# Patient Record
Sex: Female | Born: 1985 | Hispanic: Yes | Marital: Single | Smoking: Never smoker
Health system: Southern US, Community
[De-identification: ages and names within clinical notes are randomized; demographics above are authoritative.]

## PROBLEM LIST (undated history)

## (undated) DIAGNOSIS — D649 Anemia, unspecified: Secondary | ICD-10-CM

## (undated) DIAGNOSIS — I10 Essential (primary) hypertension: Secondary | ICD-10-CM

## (undated) DIAGNOSIS — R569 Unspecified convulsions: Secondary | ICD-10-CM

## (undated) HISTORY — PX: TUBAL LIGATION: SHX77

---

## 2013-12-06 ENCOUNTER — Emergency Department (HOSPITAL_COMMUNITY)
Admission: EM | Admit: 2013-12-06 | Discharge: 2013-12-07 | Disposition: A | Discharge status: Home / Self Care | Payer: Medicaid Other | Attending: Emergency Medicine | Admitting: Emergency Medicine

## 2013-12-06 ENCOUNTER — Encounter (HOSPITAL_COMMUNITY): Payer: Self-pay | Admitting: Emergency Medicine

## 2013-12-06 DIAGNOSIS — R102 Pelvic and perineal pain unspecified side: Secondary | ICD-10-CM

## 2013-12-06 DIAGNOSIS — N949 Unspecified condition associated with female genital organs and menstrual cycle: Secondary | ICD-10-CM | POA: Insufficient documentation

## 2013-12-06 DIAGNOSIS — N938 Other specified abnormal uterine and vaginal bleeding: Secondary | ICD-10-CM

## 2013-12-06 DIAGNOSIS — Z3202 Encounter for pregnancy test, result negative: Secondary | ICD-10-CM | POA: Insufficient documentation

## 2013-12-06 LAB — URINALYSIS, ROUTINE W REFLEX MICROSCOPIC
Bilirubin Urine: NEGATIVE
Glucose, UA: NEGATIVE mg/dL
Ketones, ur: NEGATIVE mg/dL
Nitrite: NEGATIVE
PROTEIN: NEGATIVE mg/dL
Specific Gravity, Urine: 1.002 — ABNORMAL LOW (ref 1.005–1.030)
UROBILINOGEN UA: 0.2 mg/dL (ref 0.0–1.0)
pH: 7 (ref 5.0–8.0)

## 2013-12-06 LAB — CBC WITH DIFFERENTIAL/PLATELET
BASOS ABS: 0 10*3/uL (ref 0.0–0.1)
Basophils Relative: 0 % (ref 0–1)
EOS PCT: 1 % (ref 0–5)
Eosinophils Absolute: 0.1 10*3/uL (ref 0.0–0.7)
HEMATOCRIT: 37.2 % (ref 36.0–46.0)
Hemoglobin: 13.1 g/dL (ref 12.0–15.0)
LYMPHS ABS: 3.4 10*3/uL (ref 0.7–4.0)
LYMPHS PCT: 36 % (ref 12–46)
MCH: 31.8 pg (ref 26.0–34.0)
MCHC: 35.2 g/dL (ref 30.0–36.0)
MCV: 90.3 fL (ref 78.0–100.0)
MONO ABS: 0.6 10*3/uL (ref 0.1–1.0)
MONOS PCT: 6 % (ref 3–12)
NEUTROS ABS: 5.5 10*3/uL (ref 1.7–7.7)
Neutrophils Relative %: 58 % (ref 43–77)
Platelets: 193 10*3/uL (ref 150–400)
RBC: 4.12 MIL/uL (ref 3.87–5.11)
RDW: 12.3 % (ref 11.5–15.5)
WBC: 9.6 10*3/uL (ref 4.0–10.5)

## 2013-12-06 LAB — COMPREHENSIVE METABOLIC PANEL
ALT: 12 U/L (ref 0–35)
AST: 15 U/L (ref 0–37)
Albumin: 4 g/dL (ref 3.5–5.2)
Alkaline Phosphatase: 52 U/L (ref 39–117)
BILIRUBIN TOTAL: 0.4 mg/dL (ref 0.3–1.2)
BUN: 8 mg/dL (ref 6–23)
CHLORIDE: 102 meq/L (ref 96–112)
CO2: 24 meq/L (ref 19–32)
Calcium: 9.1 mg/dL (ref 8.4–10.5)
Creatinine, Ser: 0.79 mg/dL (ref 0.50–1.10)
GLUCOSE: 90 mg/dL (ref 70–99)
Potassium: 4.1 mEq/L (ref 3.7–5.3)
Sodium: 138 mEq/L (ref 137–147)
Total Protein: 7.5 g/dL (ref 6.0–8.3)

## 2013-12-06 LAB — URINE MICROSCOPIC-ADD ON

## 2013-12-06 LAB — POC URINE PREG, ED: Preg Test, Ur: NEGATIVE

## 2013-12-06 MED ORDER — OXYCODONE-ACETAMINOPHEN 5-325 MG PO TABS
1.0000 | ORAL_TABLET | Freq: Once | ORAL | Status: AC
  Administered 2013-12-06: 1 via ORAL
  Filled 2013-12-06: qty 1

## 2013-12-06 NOTE — ED Notes (Signed)
Pt looking very uncomfortable; moaning in pain; asked if anything RN could give her.

## 2013-12-06 NOTE — ED Notes (Addendum)
C/o L sided pelvic pain x 3 weeks.  Reports regular BM daily.  Denies urinary complaints. LMP in January.  History of tubal ligation.

## 2013-12-07 ENCOUNTER — Emergency Department (HOSPITAL_COMMUNITY): Payer: Medicaid Other

## 2013-12-07 MED ORDER — HYDROCODONE-ACETAMINOPHEN 5-325 MG PO TABS: 1.0000 | ORAL_TABLET | Freq: Four times a day (QID) | ORAL | Status: DC | PRN

## 2013-12-07 MED ORDER — IBUPROFEN 200 MG PO TABS
600.0000 mg | ORAL_TABLET | Freq: Once | ORAL | Status: AC
  Administered 2013-12-07: 600 mg via ORAL
  Filled 2013-12-07 (×2): qty 1

## 2013-12-07 MED ORDER — HYDROCODONE-ACETAMINOPHEN 5-325 MG PO TABS
1.0000 | ORAL_TABLET | Freq: Once | ORAL | Status: AC
Start: 2013-12-07 — End: 2013-12-07
  Administered 2013-12-07: 1 via ORAL
  Filled 2013-12-07: qty 1

## 2013-12-07 MED ORDER — IBUPROFEN 600 MG PO TABS: 600.0000 mg | ORAL_TABLET | Freq: Four times a day (QID) | ORAL | Status: DC | PRN

## 2013-12-07 NOTE — ED Notes (Signed)
Back from US.

## 2013-12-07 NOTE — ED Notes (Signed)
[facility] pt alert, NAD, calm, interactive, "feels better", [facility]

## 2013-12-07 NOTE — ED Notes (Signed)
Pt lying in dark, watching movie on phone, [facility] rates pain 5/10, NAD, calm, interactive, resps e/u, speaking in clear complete sentences, VSS. US results reviewed, pending EDP re-eval.

## 2013-12-07 NOTE — ED Notes (Signed)
Pt to US.

## 2013-12-07 NOTE — ED Provider Notes (Signed)
CSN: [id number]     Arrival date & time 12/06/13  2010 History   First MD Initiated Contact with Patient 12/06/13 2351     Chief Complaint  Patient presents with  . Pelvic Pain     (Consider location/radiation/quality/duration/timing/severity/associated sxs/prior Treatment) HPI Comments: Along with the pelvic pain, pt denies any vaginal bleeding or discharge. Pt seen at [facility] ED, had a pelvic exam and discharged to f/u with Gyne - but she has been awaiting follow up for  The past 2+ weeks, and her pain is getting unbearable. Upreg is neg.  Patient is a 28 y.o. female presenting with pelvic pain. The history is provided by the patient.  Pelvic Pain This is a new problem. Episode onset: 3 weeks ago. The problem occurs constantly. The problem has not changed since onset.Pertinent negatives include no chest pain, no abdominal pain, no headaches and no shortness of breath. Nothing aggravates the symptoms. Nothing relieves the symptoms. She has tried acetaminophen and rest for the symptoms. The treatment provided mild relief.    History reviewed. No pertinent past medical history. Past Surgical History  Procedure Laterality Date  . Cesarean section    . Tubal ligation     No family history on file. History  Substance Use Topics  . Smoking status: Never Smoker   . Smokeless tobacco: Not on file  . Alcohol Use: No   OB History   Grav Para Term Preterm Abortions TAB SAB Ect Mult Living                 Review of Systems  Constitutional: Negative for activity change.  Respiratory: Negative for shortness of breath.   Cardiovascular: Negative for chest pain.  Gastrointestinal: Negative for nausea, vomiting and abdominal pain.  Genitourinary: Positive for pelvic pain. Negative for dysuria, vaginal bleeding and vaginal discharge.  Musculoskeletal: Negative for neck pain.  Neurological: Negative for headaches.      Allergies  Review of patient's allergies indicates no known  allergies.  Home Medications   Current Outpatient Rx  Name  Route  Sig  Dispense  Refill  . acetaminophen (TYLENOL) 500 MG tablet   Oral   Take 1,000 mg by mouth every 6 (six) hours as needed for moderate pain.          BP 114/63  Pulse 59  Temp(Src) 97.8 F (36.6 C) (Oral)  Resp 14  Ht 5\' 4"  (1.626 m)  Wt 140 lb (63.504 kg)  BMI 24.02 kg/m2  SpO2 100%  LMP 09/27/2013 Physical Exam  Nursing note and vitals reviewed. Constitutional: She is oriented to person, place, and time. She appears well-developed and well-nourished.  HENT:  Head: Normocephalic and atraumatic.  Eyes: Conjunctivae and EOM are normal. Pupils are equal, round, and reactive to light.  Neck: Normal range of motion. Neck supple.  Cardiovascular: Normal rate, regular rhythm, normal heart sounds and intact distal pulses.   No murmur heard. Pulmonary/Chest: Effort normal. No respiratory distress. She has no wheezes.  Abdominal: Soft. Bowel sounds are normal. She exhibits no distension. There is tenderness. There is no rebound and no guarding.  LLQ tenderness.  Genitourinary: Vagina normal and uterus normal.  External exam - normal, no lesions Speculum exam: Pt has some dark blood, no discharge. Bimanual exam: Patient has no CMT, no adnexal tenderness or fullness and cervical os is closed  Neurological: She is alert and oriented to person, place, and time.  Skin: Skin is warm and dry.    ED Course  Procedures (including critical care time) Labs Review Labs Reviewed  URINALYSIS, ROUTINE W REFLEX MICROSCOPIC - Abnormal; Notable for the following:    APPearance CLOUDY (*)    Specific Gravity, Urine 1.002 (*)    Hgb urine dipstick LARGE (*)    Leukocytes, UA LARGE (*)    All other components within normal limits  URINE MICROSCOPIC-ADD ON - Abnormal; Notable for the following:    Squamous Epithelial / LPF MANY (*)    Bacteria, UA FEW (*)    All other components within normal limits  CBC WITH DIFFERENTIAL   COMPREHENSIVE METABOLIC PANEL  POC URINE PREG, ED   Imaging Review No results found.   EKG Interpretation None      MDM   Final diagnoses:  None    DDx includes: Colitis Abscess Diverticulitis Peritonitis Hernia Nephrolithiasis Pyelonephritis UTI/Cystitis Ovarian cyst TOA Ectopic pregnancy PID STD  PT comes in with cc of abd pain. Lower quadrant abd pain. Pelvic exam benign, but we did appreciate blood, and there is LLQ pain. US ordered - likely is ovarian cyst - however, it showed no pathology at all. D/C with Gyne f/u.        Derwood KaplanAnkit Prayan Ulin, MD 12/07/13 [phone]

## 2013-12-07 NOTE — Discharge Instructions (Signed)
Please see the gynecologist for the pain. Results of the ultrasound are normal as below:  EXAM:  TRANSABDOMINAL AND TRANSVAGINAL ULTRASOUND OF PELVIS  TECHNIQUE:  Both transabdominal and transvaginal ultrasound examinations of the  pelvis were performed. Transabdominal technique was performed for  global imaging of the pelvis including uterus, ovaries, adnexal  regions, and pelvic cul-de-sac. It was necessary to proceed with  endovaginal exam following the transabdominal exam to visualize the  ovaries.  COMPARISON: No priors.  FINDINGS:  Uterus  Measurements: 9.6 x 4.2 x 5.2 cm. No fibroids or other mass  visualized.  Endometrium  Thickness: 2.5 mm. No focal abnormality visualized.  Right ovary  Measurements: 2.9 x 1.7 x 1.7 cm. Normal appearance/no adnexal mass.  Left ovary  Measurements: 2.8 x 1.9 x 2.1 cm. Normal appearance/no adnexal mass.  Other findings  No free fluid.  IMPRESSION:  1. No acute findings to account for the patient's symptoms.  2. No definite fibroids or endometrial abnormality.  Electronically Signed  By: Trudie Reed M.D.  On: 12/07/2013 04:24    Abdominal Pain, Women Abdominal (stomach, pelvic, or belly) pain can be caused by many things. It is important to tell your doctor:  The location of the pain.  Does it come and go or is it present all the time?  Are there things that start the pain (eating certain foods, exercise)?  Are there other symptoms associated with the pain (fever, nausea, vomiting, diarrhea)? All of this is helpful to know when trying to find the cause of the pain. CAUSES   Stomach: virus or bacteria infection, or ulcer.  Intestine: appendicitis (inflamed appendix), regional ileitis (Crohn's disease), ulcerative colitis (inflamed colon), irritable bowel syndrome, diverticulitis (inflamed diverticulum of the colon), or cancer of the stomach or intestine.  Gallbladder disease or stones in the gallbladder.  Kidney disease,  kidney stones, or infection.  Pancreas infection or cancer.  Fibromyalgia (pain disorder).  Diseases of the female organs:  Uterus: fibroid (non-cancerous) tumors or infection.  Fallopian tubes: infection or tubal pregnancy.  Ovary: cysts or tumors.  Pelvic adhesions (scar tissue).  Endometriosis (uterus lining tissue growing in the pelvis and on the pelvic organs).  Pelvic congestion syndrome (female organs filling up with blood just before the menstrual period).  Pain with the menstrual period.  Pain with ovulation (producing an egg).  Pain with an IUD (intrauterine device, birth control) in the uterus.  Cancer of the female organs.  Functional pain (pain not caused by a disease, may improve without treatment).  Psychological pain.  Depression. DIAGNOSIS  Your doctor will decide the seriousness of your pain by doing an examination.  Blood tests.  X-rays.  Ultrasound.  CT scan (computed tomography, special type of X-ray).  MRI (magnetic resonance imaging).  Cultures, for infection.  Barium enema (dye inserted in the large intestine, to better view it with X-rays).  Colonoscopy (looking in intestine with a lighted tube).  Laparoscopy (minor surgery, looking in abdomen with a lighted tube).  Major abdominal exploratory surgery (looking in abdomen with a large incision). TREATMENT  The treatment will depend on the cause of the pain.   Many cases can be observed and treated at home.  Over-the-counter medicines recommended by your caregiver.  Prescription medicine.  Antibiotics, for infection.  Birth control pills, for painful periods or for ovulation pain.  Hormone treatment, for endometriosis.  Nerve blocking injections.  Physical therapy.  Antidepressants.  Counseling with a psychologist or psychiatrist.  Minor or major surgery. HOME CARE  INSTRUCTIONS   Do not take laxatives, unless directed by your caregiver.  Take over-the-counter  pain medicine only if ordered by your caregiver. Do not take aspirin because it can cause an upset stomach or bleeding.  Try a clear liquid diet (broth or water) as ordered by your caregiver. Slowly move to a bland diet, as tolerated, if the pain is related to the stomach or intestine.  Have a thermometer and take your temperature several times a day, and record it.  Bed rest and sleep, if it helps the pain.  Avoid sexual intercourse, if it causes pain.  Avoid stressful situations.  Keep your follow-up appointments and tests, as your caregiver orders.  If the pain does not go away with medicine or surgery, you may try:  Acupuncture.  Relaxation exercises (yoga, meditation).  Group therapy.  Counseling. SEEK MEDICAL CARE IF:   You notice certain foods cause stomach pain.  Your home care treatment is not helping your pain.  You need stronger pain medicine.  You want your IUD removed.  You feel faint or lightheaded.  You develop nausea and vomiting.  You develop a rash.  You are having side effects or an allergy to your medicine. SEEK IMMEDIATE MEDICAL CARE IF:   Your pain does not go away or gets worse.  You have a fever.  Your pain is felt only in portions of the abdomen. The right side could possibly be appendicitis. The left lower portion of the abdomen could be colitis or diverticulitis.  You are passing blood in your stools (bright red or black tarry stools, with or without vomiting).  You have blood in your urine.  You develop chills, with or without a fever.  You pass out. MAKE SURE YOU:   Understand these instructions.  Will watch your condition.  Will get help right away if you are not doing well or get worse. Document Released: 06/28/2007 Document Revised: 11/23/2011 Document Reviewed: 07/18/2009 [place] Patient Information 2014 [place], [place].

## 2016-06-01 ENCOUNTER — Emergency Department (HOSPITAL_COMMUNITY)
Admission: EM | Admit: 2016-06-01 | Discharge: 2016-06-01 | Disposition: A | Discharge status: Home / Self Care | Payer: Medicaid Other

## 2016-06-01 NOTE — ED Triage Notes (Signed)
Called for triage x 3. No response. Pt presumed to have left ED.

## 2016-06-01 NOTE — ED Notes (Signed)
Patient called by Laban Emperorarrell, EMT x3 in main ED waiting area with no response

## 2016-06-01 NOTE — ED Notes (Signed)
Patient called x2 in main ED waiting area with no response 

## 2017-02-22 ENCOUNTER — Emergency Department (HOSPITAL_COMMUNITY)
Admission: EM | Admit: 2017-02-22 | Discharge: 2017-02-22 | Disposition: A | Discharge status: Home / Self Care | Payer: No Typology Code available for payment source | Attending: Emergency Medicine | Admitting: Emergency Medicine

## 2017-02-22 ENCOUNTER — Encounter (HOSPITAL_COMMUNITY): Payer: Self-pay

## 2017-02-22 ENCOUNTER — Emergency Department (HOSPITAL_COMMUNITY): Payer: No Typology Code available for payment source

## 2017-02-22 DIAGNOSIS — M25531 Pain in right wrist: Secondary | ICD-10-CM

## 2017-02-22 DIAGNOSIS — Y939 Activity, unspecified: Secondary | ICD-10-CM | POA: Diagnosis not present

## 2017-02-22 DIAGNOSIS — Z79899 Other long term (current) drug therapy: Secondary | ICD-10-CM | POA: Diagnosis not present

## 2017-02-22 DIAGNOSIS — M7918 Myalgia, other site: Secondary | ICD-10-CM

## 2017-02-22 DIAGNOSIS — I10 Essential (primary) hypertension: Secondary | ICD-10-CM | POA: Insufficient documentation

## 2017-02-22 DIAGNOSIS — Y999 Unspecified external cause status: Secondary | ICD-10-CM | POA: Diagnosis not present

## 2017-02-22 DIAGNOSIS — Y92410 Unspecified street and highway as the place of occurrence of the external cause: Secondary | ICD-10-CM | POA: Insufficient documentation

## 2017-02-22 DIAGNOSIS — S6991XA Unspecified injury of right wrist, hand and finger(s), initial encounter: Secondary | ICD-10-CM | POA: Diagnosis present

## 2017-02-22 HISTORY — DX: Essential (primary) hypertension: I10

## 2017-02-22 HISTORY — DX: Unspecified convulsions: R56.9

## 2017-02-22 MED ORDER — IBUPROFEN 200 MG PO TABS
600.0000 mg | ORAL_TABLET | Freq: Once | ORAL | Status: AC
  Administered 2017-02-22: 600 mg via ORAL
  Filled 2017-02-22: qty 1

## 2017-02-22 MED ORDER — METHOCARBAMOL 500 MG PO TABS: 500.0000 mg | ORAL_TABLET | Freq: Two times a day (BID) | ORAL | 0 refills | Status: DC

## 2017-02-22 MED ORDER — METHOCARBAMOL 500 MG PO TABS
1000.0000 mg | ORAL_TABLET | Freq: Once | ORAL | Status: AC
  Administered 2017-02-22: 1000 mg via ORAL
  Filled 2017-02-22: qty 2

## 2017-02-22 MED ORDER — IBUPROFEN 600 MG PO TABS: 600.0000 mg | ORAL_TABLET | Freq: Four times a day (QID) | ORAL | 0 refills | Status: DC | PRN
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
## 2017-02-22 NOTE — ED Notes (Signed)
Patient transported to X-ray 

## 2017-09-25 ENCOUNTER — Encounter (HOSPITAL_COMMUNITY): Payer: Self-pay | Admitting: Emergency Medicine

## 2017-09-25 ENCOUNTER — Emergency Department (HOSPITAL_COMMUNITY): Payer: Self-pay

## 2017-09-25 ENCOUNTER — Other Ambulatory Visit: Payer: Self-pay

## 2017-09-25 ENCOUNTER — Emergency Department (HOSPITAL_COMMUNITY)
Admission: EM | Admit: 2017-09-25 | Discharge: 2017-09-25 | Disposition: A | Discharge status: Home / Self Care | Payer: Self-pay | Attending: Emergency Medicine | Admitting: Emergency Medicine

## 2017-09-25 DIAGNOSIS — Z79899 Other long term (current) drug therapy: Secondary | ICD-10-CM | POA: Insufficient documentation

## 2017-09-25 DIAGNOSIS — I10 Essential (primary) hypertension: Secondary | ICD-10-CM | POA: Insufficient documentation

## 2017-09-25 DIAGNOSIS — R55 Syncope and collapse: Secondary | ICD-10-CM | POA: Insufficient documentation

## 2017-09-25 DIAGNOSIS — G40909 Epilepsy, unspecified, not intractable, without status epilepticus: Secondary | ICD-10-CM | POA: Insufficient documentation

## 2017-09-25 HISTORY — DX: Unspecified convulsions: R56.9

## 2017-09-25 LAB — COMPREHENSIVE METABOLIC PANEL
ALBUMIN: 3.9 g/dL (ref 3.5–5.0)
ALK PHOS: 43 U/L (ref 38–126)
ALT: 11 U/L — AB (ref 14–54)
ANION GAP: 8 (ref 5–15)
AST: 17 U/L (ref 15–41)
BILIRUBIN TOTAL: 0.7 mg/dL (ref 0.3–1.2)
BUN: 10 mg/dL (ref 6–20)
CALCIUM: 8.9 mg/dL (ref 8.9–10.3)
CO2: 21 mmol/L — AB (ref 22–32)
Chloride: 107 mmol/L (ref 101–111)
Creatinine, Ser: 0.68 mg/dL (ref 0.44–1.00)
GFR calc Af Amer: >60 mL/min (ref >60)
GFR calc non Af Amer: >60 mL/min (ref >60)
Glucose, Bld: 90 mg/dL (ref 65–99)
Potassium: 3.8 mmol/L (ref 3.5–5.1)
SODIUM: 136 mmol/L (ref 135–145)
TOTAL PROTEIN: 6.7 g/dL (ref 6.5–8.1)

## 2017-09-25 LAB — CBC WITH DIFFERENTIAL/PLATELET
BASOS ABS: 0 10*3/uL (ref 0.0–0.1)
BASOS PCT: 0 %
EOS ABS: 0.2 10*3/uL (ref 0.0–0.7)
Eosinophils Relative: 1 %
HEMATOCRIT: 34.3 % — AB (ref 36.0–46.0)
HEMOGLOBIN: 11.7 g/dL — AB (ref 12.0–15.0)
Lymphocytes Relative: 17 %
Lymphs Abs: 2.1 10*3/uL (ref 0.7–4.0)
MCH: 30.4 pg (ref 26.0–34.0)
MCHC: 34.1 g/dL (ref 30.0–36.0)
MCV: 89.1 fL (ref 78.0–100.0)
MONOS PCT: 9 %
Monocytes Absolute: 1.1 10*3/uL — ABNORMAL HIGH (ref 0.1–1.0)
NEUTROS ABS: 8.5 10*3/uL — AB (ref 1.7–7.7)
NEUTROS PCT: 73 %
Platelets: 162 10*3/uL (ref 150–400)
RBC: 3.85 MIL/uL — AB (ref 3.87–5.11)
RDW: 12.2 % (ref 11.5–15.5)
WBC: 11.9 10*3/uL — AB (ref 4.0–10.5)

## 2017-09-25 LAB — URINALYSIS, ROUTINE W REFLEX MICROSCOPIC
BILIRUBIN URINE: NEGATIVE
Glucose, UA: NEGATIVE mg/dL
Ketones, ur: NEGATIVE mg/dL
LEUKOCYTES UA: NEGATIVE
Nitrite: NEGATIVE
PROTEIN: NEGATIVE mg/dL
SPECIFIC GRAVITY, URINE: 1.013 (ref 1.005–1.030)
pH: 6 (ref 5.0–8.0)

## 2017-09-25 LAB — POC URINE PREG, ED: PREG TEST UR: NEGATIVE

## 2017-09-25 LAB — CBG MONITORING, ED: Glucose-Capillary: 90 mg/dL (ref 65–99)

## 2017-09-25 LAB — RAPID STREP SCREEN (MED CTR MEBANE ONLY): STREPTOCOCCUS, GROUP A SCREEN (DIRECT): NEGATIVE

## 2017-09-25 MED ORDER — SODIUM CHLORIDE 0.9 % IV BOLUS (SEPSIS)
1000.0000 mL | Freq: Once | INTRAVENOUS | Status: AC
  Administered 2017-09-25: 1000 mL via INTRAVENOUS

## 2017-09-25 NOTE — ED Notes (Signed)
Pt oob to br with steady gait 

## 2017-09-25 NOTE — ED Triage Notes (Signed)
Pt. Stated, I don't take medication  In 20 years and have had at least 5 in the 20 years without medication

## 2017-09-25 NOTE — ED Provider Notes (Signed)
[facility] EMERGENCY DEPARTMENT Provider Note   CSN: [id number] Arrival date & time: 09/25/17  4098     History   Chief Complaint Chief Complaint  Patient presents with  . Seizures    beginning    HPI Emily Mccormick is a 32 y.o. female.  The history is provided by the patient, medical records and a relative. No language interpreter was used.  Near Syncope  This is a recurrent problem. The current episode started 1 to 2 hours ago. The problem occurs rarely. The problem has been resolved. Associated symptoms include shortness of breath. Pertinent negatives include no chest pain, no abdominal pain and no headaches. Nothing aggravates the symptoms. Nothing relieves the symptoms. She has tried nothing for the symptoms. The treatment provided no relief.    Past Medical History:  Diagnosis Date  . Hypertension   . Seizure (HCC)   . Seizures (HCC)    epilepsy    There are no active problems to display for this patient.   Past Surgical History:  Procedure Laterality Date  . CESAREAN SECTION    . TUBAL LIGATION      OB History    No data available       Home Medications    Prior to Admission medications   Medication Sig Start Date End Date Taking? Authorizing Provider  acetaminophen (TYLENOL) 500 MG tablet Take 1,000 mg by mouth every 6 (six) hours as needed for moderate pain.    [provider]  HYDROcodone-acetaminophen (NORCO/VICODIN) 5-325 MG per tablet Take 1 tablet by mouth every 6 (six) hours as needed. 12/07/13   Derwood Kaplan, MD  ibuprofen (ADVIL,MOTRIN) 600 MG tablet Take 1 tablet (600 mg total) by mouth every 6 (six) hours as needed. 02/22/17   Audry Pili, PA-C  methocarbamol (ROBAXIN) 500 MG tablet Take 1 tablet (500 mg total) by mouth 2 (two) times daily. 02/22/17   Audry Pili, PA-C    Family History No family history on file.  Social History Social History   Tobacco Use  . Smoking status: Never Smoker  . Smokeless  tobacco: Never Used  Substance Use Topics  . Alcohol use: No  . Drug use: No     Allergies   Patient has no known allergies.   Review of Systems Review of Systems  Constitutional: Positive for fatigue. Negative for chills, diaphoresis and fever.  HENT: Positive for sore throat. Negative for congestion.   Eyes: Negative for visual disturbance.  Respiratory: Positive for shortness of breath. Negative for cough, chest tightness, wheezing and stridor.   Cardiovascular: Positive for near-syncope. Negative for chest pain and palpitations.  Gastrointestinal: Negative for abdominal pain, constipation, diarrhea, nausea and vomiting.  Genitourinary: Negative for dysuria, flank pain and frequency.  Musculoskeletal: Negative for back pain, neck pain and neck stiffness.  Neurological: Positive for seizures. Negative for light-headedness, numbness and headaches.  Psychiatric/Behavioral: Negative for agitation and confusion.  All other systems reviewed and are negative.    Physical Exam Updated Vital Signs BP 126/66 (BP Location: Right Arm)   Pulse 68   Temp 98.1 F (36.7 C) (Oral)   Resp 17   Ht 5\' 4"  (1.626 m)   Wt 70.3 kg (155 lb)   LMP 09/25/2017   SpO2 100%   BMI 26.61 kg/m   Physical Exam  Constitutional: She is oriented to person, place, and time. She appears well-developed and well-nourished. No distress.  HENT:  Head: Normocephalic and atraumatic.  Mouth/Throat: Oropharynx is clear and  moist. No oropharyngeal exudate.  Eyes: Conjunctivae and EOM are normal. Pupils are equal, round, and reactive to light.  Neck: Normal range of motion.  Cardiovascular: Normal rate and intact distal pulses.  No murmur heard. Pulmonary/Chest: Effort normal and breath sounds normal. No respiratory distress. She has no wheezes. She has no rales. She exhibits no tenderness.  Abdominal: Soft. She exhibits no distension. There is no tenderness.  Musculoskeletal: She exhibits no edema or  tenderness.  Neurological: She is alert and oriented to person, place, and time. She is not disoriented. She displays no tremor. No cranial nerve deficit or sensory deficit. She exhibits normal muscle tone. She displays no seizure activity. Coordination normal. GCS eye subscore is 4. GCS verbal subscore is 5. GCS motor subscore is 6.  Skin: Capillary refill takes less than 2 seconds. No rash noted. She is not diaphoretic. No erythema.  Psychiatric: She has a normal mood and affect.  Nursing note and vitals reviewed.    ED Treatments / Results  Labs (all labs ordered are listed, but only abnormal results are displayed) Labs Reviewed  CBC WITH DIFFERENTIAL/PLATELET - Abnormal; Notable for the following components:      Result Value   WBC 11.9 (*)    RBC 3.85 (*)    Hemoglobin 11.7 (*)    HCT 34.3 (*)    Neutro Abs 8.5 (*)    Monocytes Absolute 1.1 (*)    All other components within normal limits  COMPREHENSIVE METABOLIC PANEL - Abnormal; Notable for the following components:   CO2 21 (*)    ALT 11 (*)    All other components within normal limits  URINALYSIS, ROUTINE W REFLEX MICROSCOPIC - Abnormal; Notable for the following components:   APPearance HAZY (*)    Hgb urine dipstick LARGE (*)    Bacteria, UA RARE (*)    Squamous Epithelial / LPF 6-30 (*)    All other components within normal limits  RAPID STREP SCREEN (NOT AT [facility])  URINE CULTURE  CULTURE, GROUP A STREP ([facility])  CBG MONITORING, ED  POC URINE PREG, ED    EKG  EKG Interpretation  Date/Time:  Saturday September 25 2017 10:28:05 EST Ventricular Rate:  59 PR Interval:    QRS Duration: 76 QT Interval:  402 QTC Calculation: 399 R Axis:   82 Text Interpretation:  Sinus rhythm Nonspecific T abnrm, anterolateral leads ST elev, probable normal early repol pattern No prior ECG for comparison.  No STEMI Confirmed by Theda Belfastegeler, Chris ([id number]) on 09/25/2017 10:42:57 AM       Radiology Dg Chest 2 View  Result Date:  09/25/2017 CLINICAL DATA:  Syncope, shortness of breath, fatigue EXAM: CHEST  2 VIEW COMPARISON:  06/19/2017 FINDINGS: Heart and mediastinal contours are within normal limits. No focal opacities or effusions. No acute bony abnormality. IMPRESSION: No active cardiopulmonary disease. Electronically Signed   By: Charlett NoseKevin  Dover M.D.   On: 09/25/2017 10:35    Procedures Procedures (including critical care time)  Medications Ordered in ED Medications  sodium chloride 0.9 % bolus 1,000 mL (0 mLs Intravenous Stopped 09/25/17 1258)     Initial Impression / Assessment and Plan / ED Course  I have reviewed the triage vital signs and the nursing notes.  Pertinent labs & imaging results that were available during my care of the patient were reviewed by me and considered in my medical decision making (see chart for details).     Emily Mccormick is a 32 y.o. female with a past  medical history significant for epilepsy not on antiseizure medications who presents with sensation of oncoming seizure versus near syncopal episode.  Patient reports that this morning, she had an episode that lasted approximately 10 seconds where she thought she was going to have a seizure.  She felt generalized fatigue, overall weakness, lightheadedness, and felt like she was going to pass out and sees.  She reports some nausea and then had an episode of vomiting.  After vomiting she had some shortness of breath and could not catch her breath.  This lasted for several minutes before slowly improving.  She says that she did not have a seizure.  She reports that she has felt the sensation before she had a seizure years ago.  She reports that over the last 20 years she has had approximately 4 seizures and has not been taking medication since 2010 when she was initially managed by neuro.  She does not member what medication she was on previously.  During the episode today, she denies any chest pain, palpitations, or severe headaches.  She denies  any recent traumatic injuries to her head or other parts of her body.  She denies any leg pain, leg swelling, fevers, or chills.  She denies any neck pain or neck stiffness.  She does report that she has had an increase in caffeine intake over the last month or 2 and is also had worsening sleep habits.  She reports that she only sleeps 3-4 hours a night.  On exam, patient had no focal neurologic deficits.  Patient has symmetric grip strength in upper extremities with normal sensation.  Patient reports that immediately following the nursing office that she had some tingling in her right arm that has resolved.  She had clear lungs and chest was nontender.  Abdomen was nontender.  No significant lower extremity swelling or tenderness.  Suspect the patient had more of a near syncopal episode in a near seizure episode however, patient will have workup to look for elective abnormality, dehydration, or occult infection may contribute to her symptoms.  Patient will be given fluids for her near syncopal episode.    As patient is not on seizure medicine, patient may have lowered her seizure threshold in the setting of caffeine intake, poor sleep, and dehydration.  Next  As patient reports she had a slightly sore throat at one point, patient will have a strep swab as well.    As patient has no focal neurologic deficits on my initial exam and has no headache, current vision changes, nausea, vomiting, do not feel CT head would be needed given her lack of actual seizure episode.  Anticipate reassessment after diagnostic testing.      Diagnostic testing was overall reassuring.  No evidence of nitrites or leukocytes for UTI.  No evidence of pneumonia.  No significant I ab normalities.  Next  I suspect patient was dehydrated causing her near syncopal episode.  Patient may have interpreted this as being near seizure-like.  Patient will follow up with her PCP and neurologist in the next several days for reassessment  and discuss restarting seizure medications.  Patient felt much better and was not feeling lightheaded upon standing.  Patient was felt to be safe for discharge home and was discharged in good condition with understanding of plan of care and return precautions.   Final Clinical Impressions(s) / ED Diagnoses   Final diagnoses:  Near syncope    Clinical Impression: 1. Near syncope     Disposition: Discharge  Condition: Good  I have discussed the results, Dx and Tx plan with the pt(& family if present). He/she/they expressed understanding and agree(s) with the plan. Discharge instructions discussed at great length. Strict return precautions discussed and pt &/or family have verbalized understanding of the instructions. No further questions at time of discharge.    Discharge Medication List as of 09/25/2017  2:52 PM      Follow Up: Shelle Iron, MD [address] [place] [address] [phone]     [facility] EMERGENCY DEPARTMENT [address] [id number] mc [place] [address] [phone]  If symptoms worsen     Tegeler, Canary Brim, MD 09/25/17 [phone]

## 2017-09-25 NOTE — Discharge Instructions (Signed)
Your workup today was overall reassuring.  We suspect you are slightly dehydrated causing your near syncopal episode and made you feel as if you are going to have a seizure.  Please follow-up with your outpatient neurology team for reassessment and further management of your seizures.  Please stay hydrated.  If any symptoms change or worsen, please return to the nearest emergency department.

## 2017-09-25 NOTE — ED Notes (Signed)
Pt stats she understands instructions. Home stable with steady gait. 

## 2017-09-25 NOTE — ED Triage Notes (Signed)
Pt. Stated, I was going to have a seizure and just had the beginnings of one. It was hard to breath and it was hard to answer questions from my friend.I was feeling weakness but came out of it. Now I feel shivery.

## 2017-09-26 LAB — URINE CULTURE: Culture: NO GROWTH

## 2017-09-27 LAB — CULTURE, GROUP A STREP (THRC)

## 2019-02-28 IMAGING — CR DG CHEST 2V
2 series · 2 of 2 positions shown · non-contrast
Comparison: 06/19/2017

CLINICAL DATA: Syncope, shortness of breath, fatigue

EXAM:
CHEST  2 VIEW

[chest lat]
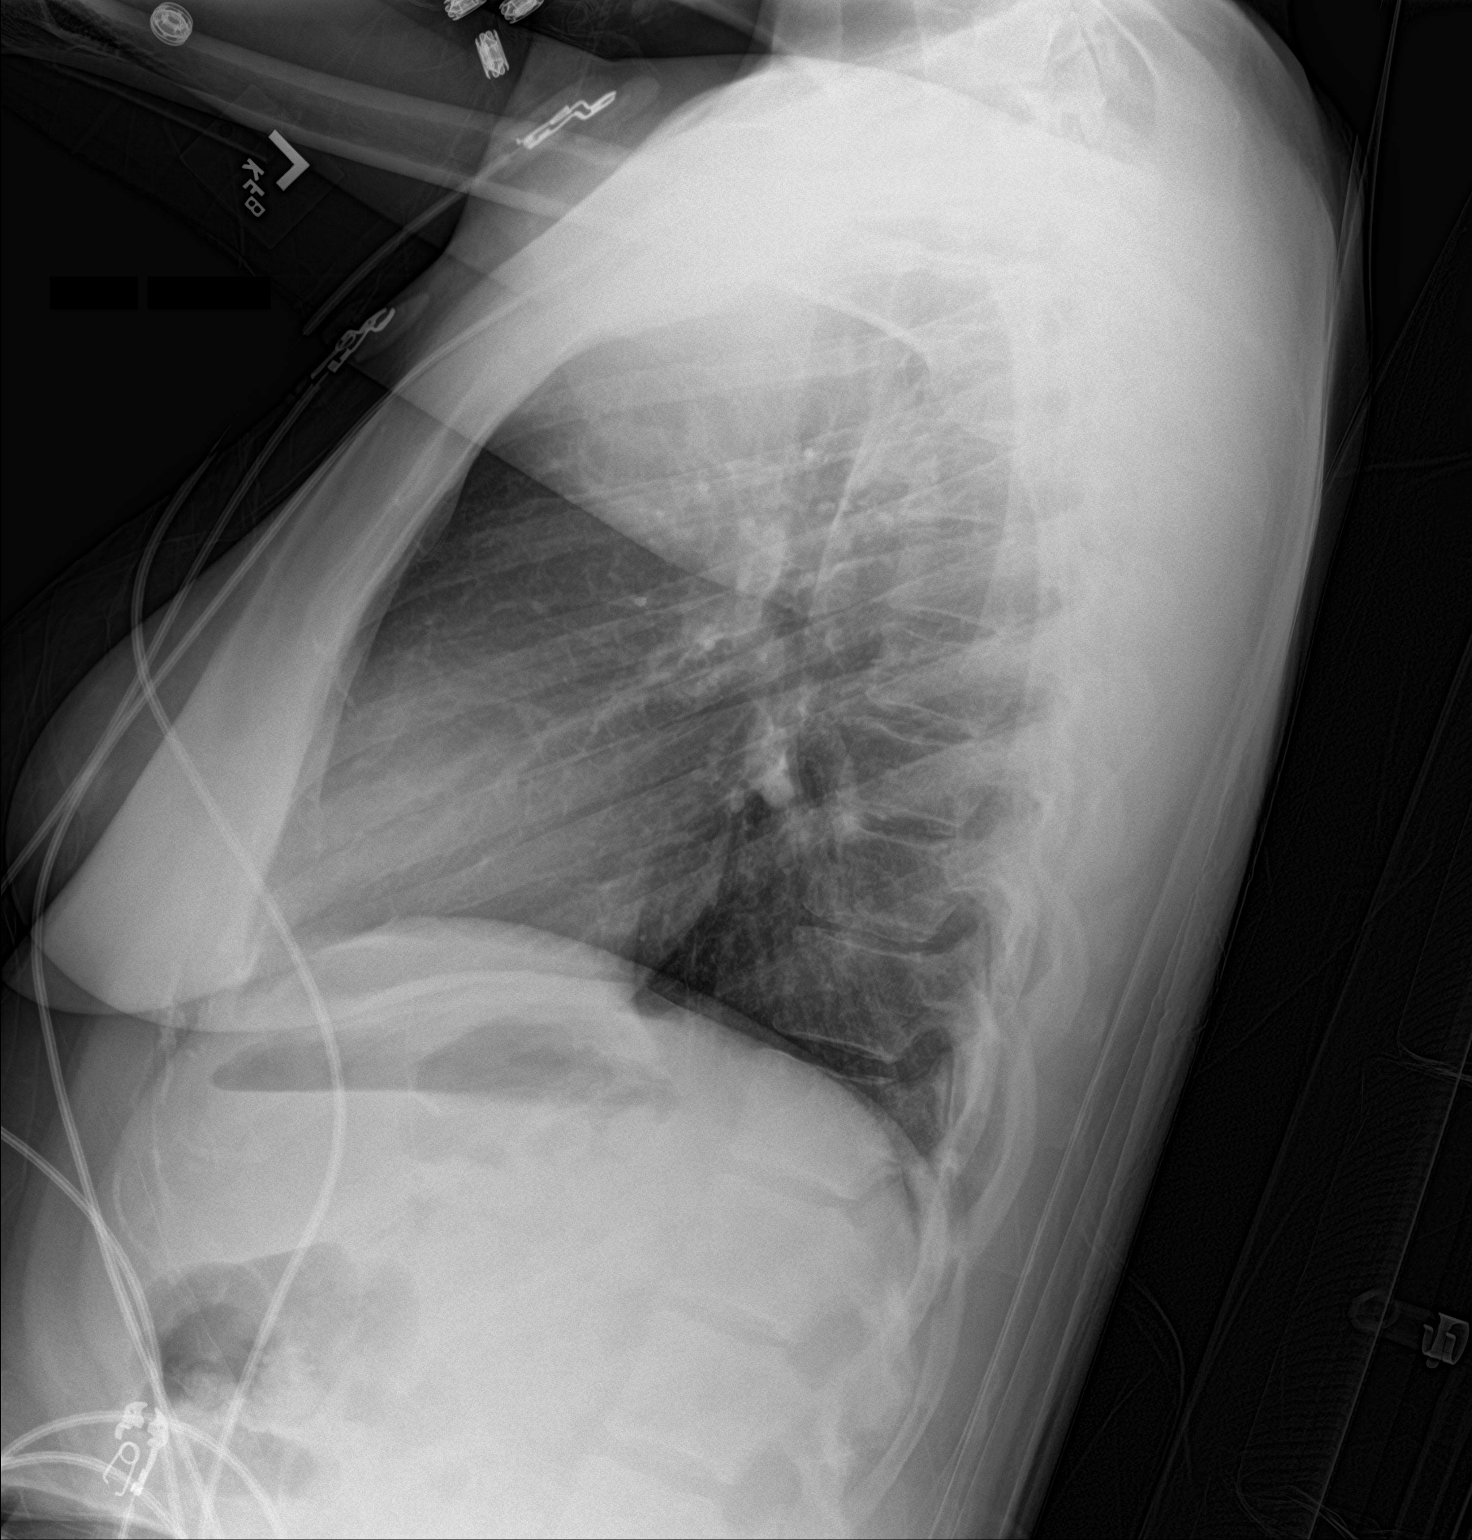

[chest ap]
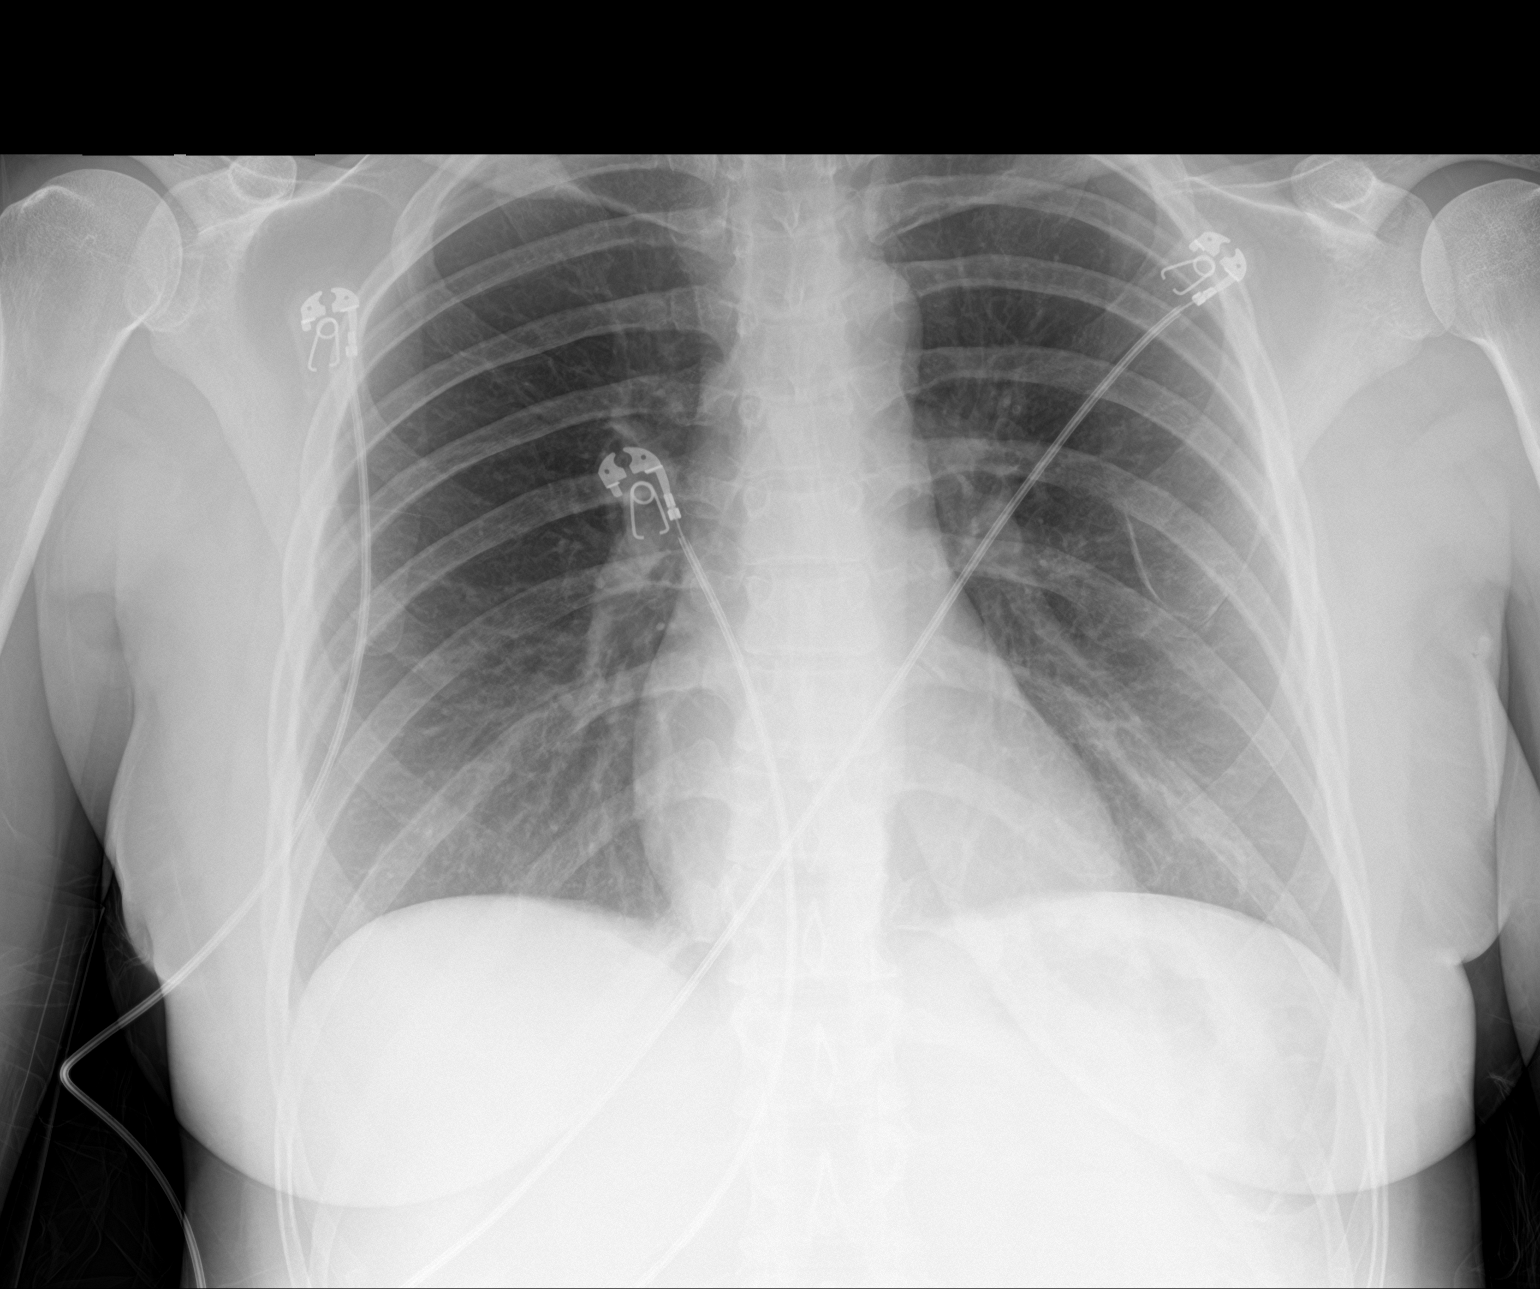

[2 of 2 positions shown; findings below may reference images not displayed]

FINDINGS: Heart and mediastinal contours are within normal limits. No focal
opacities or effusions. No acute bony abnormality.
IMPRESSION: No active cardiopulmonary disease.

## 2019-04-08 ENCOUNTER — Emergency Department (HOSPITAL_COMMUNITY)
Admission: EM | Admit: 2019-04-08 | Discharge: 2019-04-08 | Disposition: A | Discharge status: Home / Self Care | Payer: Self-pay | Attending: Emergency Medicine | Admitting: Emergency Medicine

## 2019-04-08 ENCOUNTER — Encounter (HOSPITAL_COMMUNITY): Payer: Self-pay

## 2019-04-08 ENCOUNTER — Other Ambulatory Visit: Payer: Self-pay

## 2019-04-08 DIAGNOSIS — N644 Mastodynia: Secondary | ICD-10-CM | POA: Insufficient documentation

## 2019-04-08 DIAGNOSIS — I10 Essential (primary) hypertension: Secondary | ICD-10-CM | POA: Insufficient documentation

## 2019-04-08 LAB — PREGNANCY, URINE: Preg Test, Ur: NEGATIVE

## 2019-04-08 NOTE — ED Provider Notes (Signed)
[facility] EMERGENCY DEPARTMENT Provider Note   CSN: [id number] Arrival date & time: 04/08/19  1845    History   Chief Complaint Chief Complaint  Patient presents with  . Breast Pain    Right    HPI Emily Mccormick is a 33 y.o. female.     Emily Mccormick is a 33 y.o. female with a history of hypertension and seizures, who presents to the emergency department for evaluation of right breast discomfort.  Patient reports she noticed some tenderness in the left lower quadrant of her right breast yesterday while taking a shower.  When she looked in the mirror area she seemed to notice a line in the inner breast in this area and that it feels like the breast has "dropped".  She reports it feels firm and tender in this area she has not noted any associated redness or skin changes.  No nipple discharge or inversion.  No fevers or chills.  She does not think she is currently pregnant, her most recent menstrual cycle just ended a day or 2 ago.  No prior history of similar breast tenderness or pain.  She has not taken anything for symptoms prior to arrival.  Not currently breast-feeding.  No other aggravating or alleviating factors.     Past Medical History:  Diagnosis Date  . Hypertension   . Seizure ([place])   . Seizures ([place])    epilepsy    There are no active problems to display for this patient.   Past Surgical History:  Procedure Laterality Date  . CESAREAN SECTION    . TUBAL LIGATION       OB History   No obstetric history on file.      Home Medications    Prior to Admission medications   Medication Sig Start Date End Date Taking? Authorizing Provider  acetaminophen (TYLENOL) 325 MG tablet Take 325 mg by mouth every 6 (six) hours as needed for moderate pain.     [provider]  HYDROcodone-acetaminophen (NORCO/VICODIN) 5-325 MG per tablet Take 1 tablet by mouth every 6 (six) hours as needed. Patient not taking: Reported on 09/25/2017 12/07/13    Varney Biles, MD  ibuprofen (ADVIL,MOTRIN) 600 MG tablet Take 1 tablet (600 mg total) by mouth every 6 (six) hours as needed. Patient not taking: Reported on 09/25/2017 02/22/17   Shary Decamp, PA-C  methocarbamol (ROBAXIN) 500 MG tablet Take 1 tablet (500 mg total) by mouth 2 (two) times daily. Patient not taking: Reported on 09/25/2017 02/22/17   Shary Decamp, PA-C    Family History No family history on file.  Social History Social History   Tobacco Use  . Smoking status: Never Smoker  . Smokeless tobacco: Never Used  Substance Use Topics  . Alcohol use: No  . Drug use: Yes    Types: Marijuana    Comment: last smoked this am     Allergies   Patient has no known allergies.   Review of Systems Review of Systems  Constitutional: Negative for chills and fever.  Respiratory: Negative for cough and shortness of breath.   Cardiovascular: Negative for chest pain.  Genitourinary: Negative for menstrual problem, vaginal bleeding and vaginal discharge.  Skin: Negative for color change and rash.       R breast pain     Physical Exam Updated Vital Signs BP 115/61 (BP Location: Right Arm)   Pulse 63   Temp 98.3 F (36.8 C) (Oral)   Resp 16   Ht  5\' 4"  (1.626 m)   Wt 68 kg   LMP 04/07/2019   SpO2 99%   BMI 25.75 kg/m   Physical Exam Vitals signs and nursing note reviewed.  Constitutional:      General: She is not in acute distress.    Appearance: Normal appearance. She is well-developed and normal weight. She is not ill-appearing or diaphoretic.  HENT:     Head: Normocephalic and atraumatic.  Eyes:     General:        Right eye: No discharge.        Left eye: No discharge.  Cardiovascular:     Rate and Rhythm: Normal rate and regular rhythm.     Heart sounds: Normal heart sounds. No murmur. No friction rub. No gallop.   Pulmonary:     Effort: Pulmonary effort is normal. No respiratory distress.     Breath sounds: Normal breath sounds.     Comments: Respirations  equal and unlabored, patient able to speak in full sentences, lungs clear to auscultation bilaterally Chest:       Comments: Right breast tenderness, no skin changes, nipple is normal without discharge. No tenderness over left breast Neurological:     Mental Status: She is alert.     Coordination: Coordination normal.  Psychiatric:        Behavior: Behavior normal.      ED Treatments / Results  Labs (all labs ordered are listed, but only abnormal results are displayed) Labs Reviewed  PREGNANCY, URINE    EKG None  Radiology No results found.  Procedures Procedures (including critical care time)  Medications Ordered in ED Medications - No data to display   Initial Impression / Assessment and Plan / ED Course  I have reviewed the triage vital signs and the nursing notes.  Pertinent labs & imaging results that were available during my care of the patient were reviewed by me and considered in my medical decision making (see chart for details).  Patient presents with right breast tenderness that she noted yesterday in the shower, also feels that her breast is sitting a bit lower than usual.  No injury or trauma.  No overlying skin changes, not currently breast-feeding and no signs of infection.  No palpable abscess or fluctuance.  I cannot feel a discrete mass in this area but patient does have dense breast tissue at baseline, could be fibrocystic changes.  We will have the patient follow-up with the breast center for imaging, discussed with patient that she will need to contact her PCP as well to have order placed.  Motrin, Tylenol and warm compresses as needed for pain relief.  Return precautions discussed.  Patient expresses understanding and agreement with plan.  Discharged home in good condition.  Final Clinical Impressions(s) / ED Diagnoses   Final diagnoses:  Breast pain, right    ED Discharge Orders         Ordered    Ambulatory referral to Breast Clinic      04/08/19 1956           Legrand RamsFord, Rishith Siddoway N, PA-C 04/09/19 [id number]    Gold[place], Scott, MD 04/09/19 [phone]

## 2019-04-08 NOTE — ED Notes (Signed)
Pt attempting to drink Sprite in order to give urine sample

## 2019-04-08 NOTE — ED Triage Notes (Signed)
Pt from home, c/ o R breast discomfort; felt something hard in breast while taking shower yesterday, pt noted line in inner breast;  pt states it looks like the breast "dropped"; pt also states she feel anxious, jittery

## 2020-03-25 ENCOUNTER — Other Ambulatory Visit: Payer: Self-pay

## 2020-03-25 ENCOUNTER — Encounter (HOSPITAL_COMMUNITY): Payer: Self-pay | Admitting: Emergency Medicine

## 2020-03-25 ENCOUNTER — Emergency Department (HOSPITAL_COMMUNITY)
Admission: EM | Admit: 2020-03-25 | Discharge: 2020-03-26 | Disposition: A | Discharge status: Home / Self Care | Payer: Medicaid Other | Attending: Emergency Medicine | Admitting: Emergency Medicine

## 2020-03-25 DIAGNOSIS — R509 Fever, unspecified: Secondary | ICD-10-CM | POA: Insufficient documentation

## 2020-03-25 DIAGNOSIS — Z5321 Procedure and treatment not carried out due to patient leaving prior to being seen by health care provider: Secondary | ICD-10-CM | POA: Insufficient documentation

## 2020-03-25 DIAGNOSIS — R05 Cough: Secondary | ICD-10-CM | POA: Diagnosis present

## 2020-03-25 NOTE — ED Triage Notes (Signed)
Patient reports loss of taste , productive cough with fever and nasal congestion/rhinorhhea this week , she has no Covid vaccination .

## 2020-03-26 NOTE — ED Notes (Signed)
Patient states she is just going to take tylenol and go home because of the wait time

## 2020-03-26 NOTE — ED Notes (Signed)
Patient seen by staff leaving the waiting area.

## 2020-06-07 ENCOUNTER — Other Ambulatory Visit: Payer: Medicaid Other

## 2021-11-28 ENCOUNTER — Encounter (HOSPITAL_COMMUNITY): Payer: Self-pay | Admitting: Emergency Medicine

## 2021-11-28 ENCOUNTER — Other Ambulatory Visit: Payer: Self-pay

## 2021-11-28 ENCOUNTER — Emergency Department (HOSPITAL_COMMUNITY)
Admission: EM | Admit: 2021-11-28 | Discharge: 2021-11-28 | Disposition: A | Discharge status: Home / Self Care | Payer: Medicaid Other | Attending: Physician Assistant | Admitting: Physician Assistant

## 2021-11-28 DIAGNOSIS — Z5321 Procedure and treatment not carried out due to patient leaving prior to being seen by health care provider: Secondary | ICD-10-CM | POA: Insufficient documentation

## 2021-11-28 DIAGNOSIS — R112 Nausea with vomiting, unspecified: Secondary | ICD-10-CM | POA: Diagnosis not present

## 2021-11-28 DIAGNOSIS — N94818 Other vulvodynia: Secondary | ICD-10-CM | POA: Diagnosis present

## 2021-11-28 DIAGNOSIS — R5383 Other fatigue: Secondary | ICD-10-CM | POA: Insufficient documentation

## 2021-11-28 LAB — URINALYSIS, ROUTINE W REFLEX MICROSCOPIC
Bilirubin Urine: NEGATIVE
Glucose, UA: NEGATIVE mg/dL
Hgb urine dipstick: NEGATIVE
Ketones, ur: NEGATIVE mg/dL
Nitrite: POSITIVE — AB
Protein, ur: NEGATIVE mg/dL
Specific Gravity, Urine: 1.019 (ref 1.005–1.030)
pH: 6 (ref 5.0–8.0)

## 2021-11-28 LAB — CBC WITH DIFFERENTIAL/PLATELET
Abs Immature Granulocytes: 0.03 10*3/uL (ref 0.00–0.07)
Basophils Absolute: 0.1 10*3/uL (ref 0.0–0.1)
Basophils Relative: 1 %
Eosinophils Absolute: 0.2 10*3/uL (ref 0.0–0.5)
Eosinophils Relative: 2 %
HCT: 36.6 % (ref 36.0–46.0)
Hemoglobin: 12.3 g/dL (ref 12.0–15.0)
Immature Granulocytes: 0 %
Lymphocytes Relative: 30 %
Lymphs Abs: 2.8 10*3/uL (ref 0.7–4.0)
MCH: 30.6 pg (ref 26.0–34.0)
MCHC: 33.6 g/dL (ref 30.0–36.0)
MCV: 91 fL (ref 80.0–100.0)
Monocytes Absolute: 0.9 10*3/uL (ref 0.1–1.0)
Monocytes Relative: 10 %
Neutro Abs: 5.5 10*3/uL (ref 1.7–7.7)
Neutrophils Relative %: 57 %
Platelets: 210 10*3/uL (ref 150–400)
RBC: 4.02 MIL/uL (ref 3.87–5.11)
RDW: 12.3 % (ref 11.5–15.5)
WBC: 9.5 10*3/uL (ref 4.0–10.5)
nRBC: 0 % (ref 0.0–0.2)

## 2021-11-28 LAB — BASIC METABOLIC PANEL
Anion gap: 8 (ref 5–15)
BUN: 12 mg/dL (ref 6–20)
CO2: 26 mmol/L (ref 22–32)
Calcium: 9.2 mg/dL (ref 8.9–10.3)
Chloride: 106 mmol/L (ref 98–111)
Creatinine, Ser: 0.83 mg/dL (ref 0.44–1.00)
GFR, Estimated: >60 mL/min (ref >60)
Glucose, Bld: 71 mg/dL (ref 70–99)
Potassium: 3.9 mmol/L (ref 3.5–5.1)
Sodium: 140 mmol/L (ref 135–145)

## 2021-11-28 LAB — I-STAT BETA HCG BLOOD, ED (MC, WL, AP ONLY): I-stat hCG, quantitative: <5 m[IU]/mL (ref <5)

## 2021-11-28 NOTE — ED Notes (Signed)
Pt called for vitals x3 times no answer. ?

## 2021-11-28 NOTE — ED Triage Notes (Signed)
Pt c/o lower abdominal "lump" that radiates to her pelvis. States pain is worse when standing or sitting. Reports nausea/vomiting on Monday and increased fatigue on Tuesday. ?

## 2021-11-28 NOTE — ED Provider Triage Note (Signed)
Emergency Medicine Provider Triage Evaluation Note ? ?Emily Mccormick , a 36 y.o. female  was evaluated in triage.  Pt complains of right-sided vulvar pain. ?She states that she has had pain in her vulva.  She was seen by her PCP today who she reports was unable to feel anything.  She states that it is swollen and tender.  No fevers. ? ? ?Physical Exam  ?BP 115/61 (BP Location: Right Arm)   Pulse 69   Temp 98.5 ?F (36.9 ?C) (Oral)   Resp 12   SpO2 100%  ?Gen:   Awake, no distress   ?Resp:  Normal effort  ?MSK:   Moves extremities without difficulty  ?Other:  GU exam deferred due to triage setting.  Abdomen is soft nontender nondistended ? ?Medical Decision Making  ?Medically screening exam initiated at 6:12 PM.  Appropriate orders placed.  Perina Salvaggio was informed that the remainder of the evaluation will be completed by another provider, this initial triage assessment does not replace that evaluation, and the importance of remaining in the ED until their evaluation is complete. ? ?Will check basic labs, ua.  ?  ?Cristina Gong, PA-C ?11/28/21 1812 ? ?

## 2022-01-30 ENCOUNTER — Encounter: Payer: Self-pay | Admitting: Orthopedic Surgery

## 2022-01-30 ENCOUNTER — Ambulatory Visit (INDEPENDENT_AMBULATORY_CARE_PROVIDER_SITE_OTHER): Payer: Medicaid Other | Admitting: Orthopedic Surgery

## 2022-01-30 DIAGNOSIS — S66222A Laceration of extensor muscle, fascia and tendon of left thumb at wrist and hand level, initial encounter: Secondary | ICD-10-CM | POA: Insufficient documentation
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
## 2022-02-02 ENCOUNTER — Other Ambulatory Visit: Payer: Self-pay

## 2022-02-02 ENCOUNTER — Encounter (HOSPITAL_COMMUNITY): Payer: Self-pay | Admitting: Orthopedic Surgery

## 2022-02-02 NOTE — Pre-Procedure Instructions (Signed)
[facility] #[id number] Rosalita Levan, [place] - [address] AT [facility] [address] [place] [address] Phone: [phone] Fax: [phone]  [facility] #[id number] [facility], [place] - [address] AT [facility] [address] [place] [address] Phone: [phone] Fax: [phone]  PCP - Francee Piccolo, NP [facility] in [place], [place]  EKG - DOS  ERAS Protcol - Clears until 0900  COVID TEST- N  Anesthesia review: N  Patient verbally denies any shortness of breath, fever, cough and chest pain during phone call   -------------  SDW INSTRUCTIONS given:  Your procedure is scheduled on 02/04/22.  Report to [facility] Main Entrance "A" at 0930 A.M., and check in at the Admitting office.  Call this number if you have problems the morning of surgery:  [phone]   Remember:  Do not eat after midnight the night before your surgery  You may drink clear liquids until 0900 the morning of your surgery.   Clear liquids allowed are: Water, Non-Citrus Juices (without pulp), Carbonated Beverages, Clear Tea, Black Coffee Only, and Gatorade    Take these medicines the morning of surgery with A SIP OF WATER  N/A  As of today, STOP taking any Aspirin (unless otherwise instructed by your surgeon) Aleve, Naproxen, Ibuprofen, Motrin, Advil, Goody's, BC's, all herbal medications, fish oil, and all vitamins.                      Do not wear jewelry, make up, or nail polish            Do not wear lotions, powders, perfumes/colognes, or deodorant.            Do not shave 48 hours prior to surgery.  Men may shave face and neck.            Do not bring valuables to the hospital.            [facility] is not responsible for any belongings or valuables.  Do NOT Smoke (Tobacco/Vaping) 24 hours prior to your procedure If you use a CPAP at night, you may bring all equipment for your overnight stay.   Contacts, glasses,  dentures or bridgework may not be worn into surgery.      For patients admitted to the hospital, discharge time will be determined by your treatment team.   Patients discharged the day of surgery will not be allowed to drive home, and someone needs to stay with them for 24 hours.    Special instructions:   [place]- Preparing For Surgery  Before surgery, you can play an important role. Because skin is not sterile, your skin needs to be as free of germs as possible. You can reduce the number of germs on your skin by washing with CHG (chlorahexidine gluconate) Soap before surgery.  CHG is an antiseptic cleaner which kills germs and bonds with the skin to continue killing germs even after washing.    Oral Hygiene is also important to reduce your risk of infection.  Remember - BRUSH YOUR TEETH THE MORNING OF SURGERY WITH YOUR REGULAR TOOTHPASTE  Please do not use if you have an allergy to CHG or antibacterial soaps. If your skin becomes reddened/irritated stop using the CHG.  Do not shave (including legs and underarms) for at least 48 hours prior to first CHG shower. It is OK to shave your face.  Please follow these instructions carefully.   Shower the NIGHT BEFORE SURGERY and the MORNING OF SURGERY with DIAL Soap.   Pat yourself dry with a CLEAN TOWEL.  Wear CLEAN PAJAMAS to bed the night before surgery  Place CLEAN SHEETS on your bed the night of your first shower and DO NOT SLEEP WITH PETS.   Day of Surgery: Please shower morning of surgery  Wear Clean/Comfortable clothing the morning of surgery Do not apply any deodorants/lotions.   Remember to brush your teeth WITH YOUR REGULAR TOOTHPASTE.   Questions were answered. Patient verbalized understanding of instructions.

## 2022-02-04 ENCOUNTER — Encounter (HOSPITAL_COMMUNITY): Payer: Self-pay | Admitting: Orthopedic Surgery

## 2022-02-04 ENCOUNTER — Ambulatory Visit (HOSPITAL_BASED_OUTPATIENT_CLINIC_OR_DEPARTMENT_OTHER): Payer: Medicaid Other | Admitting: Anesthesiology

## 2022-02-04 ENCOUNTER — Other Ambulatory Visit: Payer: Self-pay

## 2022-02-04 ENCOUNTER — Ambulatory Visit (HOSPITAL_COMMUNITY)
Admission: RE | Admit: 2022-02-04 | Discharge: 2022-02-04 | Disposition: A | Discharge status: Home / Self Care | Payer: Medicaid Other | Attending: Orthopedic Surgery | Admitting: Orthopedic Surgery

## 2022-02-04 ENCOUNTER — Ambulatory Visit (HOSPITAL_COMMUNITY): Payer: Medicaid Other | Admitting: Anesthesiology

## 2022-02-04 ENCOUNTER — Encounter (HOSPITAL_COMMUNITY)
Admission: RE | Disposition: A | Discharge status: Home / Self Care | Payer: Self-pay | Source: Home / Self Care | Attending: Orthopedic Surgery

## 2022-02-04 ENCOUNTER — Ambulatory Visit (HOSPITAL_COMMUNITY): Payer: Medicaid Other

## 2022-02-04 DIAGNOSIS — S56322A Laceration of extensor or abductor muscles, fascia and tendons of left thumb at forearm level, initial encounter: Secondary | ICD-10-CM

## 2022-02-04 DIAGNOSIS — D649 Anemia, unspecified: Secondary | ICD-10-CM | POA: Insufficient documentation

## 2022-02-04 DIAGNOSIS — S66222A Laceration of extensor muscle, fascia and tendon of left thumb at wrist and hand level, initial encounter: Secondary | ICD-10-CM | POA: Diagnosis present

## 2022-02-04 DIAGNOSIS — S56429A Laceration of extensor muscle, fascia and tendon of unspecified finger at forearm level, initial encounter: Secondary | ICD-10-CM

## 2022-02-04 DIAGNOSIS — X58XXXA Exposure to other specified factors, initial encounter: Secondary | ICD-10-CM | POA: Diagnosis not present

## 2022-02-04 DIAGNOSIS — S61209A Unspecified open wound of unspecified finger without damage to nail, initial encounter: Secondary | ICD-10-CM

## 2022-02-04 HISTORY — PX: REPAIR EXTENSOR TENDON: SHX5382

## 2022-02-04 HISTORY — DX: Anemia, unspecified: D64.9

## 2022-02-04 LAB — COMPREHENSIVE METABOLIC PANEL
ALT: 11 U/L (ref 0–44)
AST: 17 U/L (ref 15–41)
Albumin: 3.4 g/dL — ABNORMAL LOW (ref 3.5–5.0)
Alkaline Phosphatase: 38 U/L (ref 38–126)
Anion gap: 4 — ABNORMAL LOW (ref 5–15)
BUN: 6 mg/dL (ref 6–20)
CO2: 24 mmol/L (ref 22–32)
Calcium: 8.7 mg/dL — ABNORMAL LOW (ref 8.9–10.3)
Chloride: 110 mmol/L (ref 98–111)
Creatinine, Ser: 0.82 mg/dL (ref 0.44–1.00)
GFR, Estimated: >60 mL/min (ref >60)
Glucose, Bld: 91 mg/dL (ref 70–99)
Potassium: 3.4 mmol/L — ABNORMAL LOW (ref 3.5–5.1)
Sodium: 138 mmol/L (ref 135–145)
Total Bilirubin: 0.6 mg/dL (ref 0.3–1.2)
Total Protein: 6.2 g/dL — ABNORMAL LOW (ref 6.5–8.1)

## 2022-02-04 LAB — CBC
HCT: 35 % — ABNORMAL LOW (ref 36.0–46.0)
Hemoglobin: 11.5 g/dL — ABNORMAL LOW (ref 12.0–15.0)
MCH: 30.3 pg (ref 26.0–34.0)
MCHC: 32.9 g/dL (ref 30.0–36.0)
MCV: 92.1 fL (ref 80.0–100.0)
Platelets: 153 10*3/uL (ref 150–400)
RBC: 3.8 MIL/uL — ABNORMAL LOW (ref 3.87–5.11)
RDW: 12 % (ref 11.5–15.5)
WBC: 12.9 10*3/uL — ABNORMAL HIGH (ref 4.0–10.5)
nRBC: 0 % (ref 0.0–0.2)

## 2022-02-04 LAB — POCT PREGNANCY, URINE: Preg Test, Ur: NEGATIVE

## 2022-02-04 SURGERY — REPAIR, TENDON, EXTENSOR
Anesthesia: General | Site: Thumb | Laterality: Left

## 2022-02-04 MED ORDER — OXYCODONE HCL 5 MG PO TABS
5.0000 mg | ORAL_TABLET | Freq: Once | ORAL | Status: AC | PRN
  Administered 2022-02-04: 5 mg via ORAL

## 2022-02-04 MED ORDER — FENTANYL CITRATE (PF) 100 MCG/2ML IJ SOLN
50.0000 ug | Freq: Once | INTRAMUSCULAR | Status: AC
  Administered 2022-02-04: 50 ug via INTRAVENOUS

## 2022-02-04 MED ORDER — HYDROMORPHONE HCL 1 MG/ML IJ SOLN
INTRAMUSCULAR | Status: AC
  Filled 2022-02-04: qty 1

## 2022-02-04 MED ORDER — FENTANYL CITRATE (PF) 250 MCG/5ML IJ SOLN
INTRAMUSCULAR | Status: AC
  Filled 2022-02-04: qty 5

## 2022-02-04 MED ORDER — MEPERIDINE HCL 25 MG/ML IJ SOLN: 6.2500 mg | INTRAMUSCULAR | Status: DC | PRN

## 2022-02-04 MED ORDER — LACTATED RINGERS IV SOLN: INTRAVENOUS | Status: DC

## 2022-02-04 MED ORDER — BUPIVACAINE HCL (PF) 0.25 % IJ SOLN
INTRAMUSCULAR | Status: DC | PRN
  Administered 2022-02-04: 4 mL

## 2022-02-04 MED ORDER — HYDROMORPHONE HCL 1 MG/ML IJ SOLN
0.2500 mg | INTRAMUSCULAR | Status: DC | PRN
  Administered 2022-02-04 (×4): 0.5 mg via INTRAVENOUS

## 2022-02-04 MED ORDER — PROPOFOL 10 MG/ML IV BOLUS
INTRAVENOUS | Status: AC
  Filled 2022-02-04: qty 20

## 2022-02-04 MED ORDER — LIDOCAINE 2% (20 MG/ML) 5 ML SYRINGE
INTRAMUSCULAR | Status: AC
  Filled 2022-02-04: qty 5

## 2022-02-04 MED ORDER — FENTANYL CITRATE (PF) 100 MCG/2ML IJ SOLN
INTRAMUSCULAR | Status: AC
  Filled 2022-02-04: qty 2

## 2022-02-04 MED ORDER — PROMETHAZINE HCL 25 MG/ML IJ SOLN: 6.2500 mg | INTRAMUSCULAR | Status: DC | PRN

## 2022-02-04 MED ORDER — OXYCODONE HCL 5 MG PO TABS: 5.0000 mg | ORAL_TABLET | Freq: Four times a day (QID) | ORAL | 0 refills | Status: AC | PRN

## 2022-02-04 MED ORDER — AMISULPRIDE (ANTIEMETIC) 5 MG/2ML IV SOLN
10.0000 mg | Freq: Once | INTRAVENOUS | Status: AC | PRN
  Administered 2022-02-04: 10 mg via INTRAVENOUS

## 2022-02-04 MED ORDER — DEXAMETHASONE SODIUM PHOSPHATE 10 MG/ML IJ SOLN
INTRAMUSCULAR | Status: DC | PRN
  Administered 2022-02-04: 10 mg via INTRAVENOUS

## 2022-02-04 MED ORDER — ONDANSETRON HCL 4 MG/2ML IJ SOLN
INTRAMUSCULAR | Status: DC | PRN
  Administered 2022-02-04: 4 mg via INTRAVENOUS

## 2022-02-04 MED ORDER — MIDAZOLAM HCL 2 MG/2ML IJ SOLN
INTRAMUSCULAR | Status: AC
  Filled 2022-02-04: qty 2

## 2022-02-04 MED ORDER — LIDOCAINE 2% (20 MG/ML) 5 ML SYRINGE
INTRAMUSCULAR | Status: DC | PRN
  Administered 2022-02-04: 60 mg via INTRAVENOUS

## 2022-02-04 MED ORDER — PHENYLEPHRINE HCL (PRESSORS) 10 MG/ML IV SOLN
INTRAVENOUS | Status: AC
  Filled 2022-02-04: qty 1

## 2022-02-04 MED ORDER — CHLORHEXIDINE GLUCONATE 0.12 % MT SOLN
15.0000 mL | Freq: Once | OROMUCOSAL | Status: AC
  Administered 2022-02-04: 15 mL via OROMUCOSAL
  Filled 2022-02-04: qty 15

## 2022-02-04 MED ORDER — PHENYLEPHRINE 80 MCG/ML (10ML) SYRINGE FOR IV PUSH (FOR BLOOD PRESSURE SUPPORT)
PREFILLED_SYRINGE | INTRAVENOUS | Status: DC | PRN
  Administered 2022-02-04: 160 ug via INTRAVENOUS
  Administered 2022-02-04 (×2): 80 ug via INTRAVENOUS

## 2022-02-04 MED ORDER — ONDANSETRON HCL 4 MG/2ML IJ SOLN
INTRAMUSCULAR | Status: AC
  Filled 2022-02-04: qty 2

## 2022-02-04 MED ORDER — MIDAZOLAM HCL 2 MG/2ML IJ SOLN
INTRAMUSCULAR | Status: DC | PRN
  Administered 2022-02-04: 2 mg via INTRAVENOUS

## 2022-02-04 MED ORDER — FENTANYL CITRATE (PF) 250 MCG/5ML IJ SOLN
INTRAMUSCULAR | Status: DC | PRN
  Administered 2022-02-04 (×5): 25 ug via INTRAVENOUS

## 2022-02-04 MED ORDER — CEFAZOLIN SODIUM-DEXTROSE 2-4 GM/100ML-% IV SOLN
2.0000 g | INTRAVENOUS | Status: AC
  Administered 2022-02-04: 2 g via INTRAVENOUS
  Filled 2022-02-04: qty 100

## 2022-02-04 MED ORDER — ORAL CARE MOUTH RINSE: 15.0000 mL | Freq: Once | OROMUCOSAL | Status: AC

## 2022-02-04 MED ORDER — BUPIVACAINE HCL (PF) 0.25 % IJ SOLN
INTRAMUSCULAR | Status: AC
  Filled 2022-02-04: qty 30

## 2022-02-04 MED ORDER — DEXAMETHASONE SODIUM PHOSPHATE 10 MG/ML IJ SOLN
INTRAMUSCULAR | Status: AC
  Filled 2022-02-04: qty 1

## 2022-02-04 MED ORDER — PROPOFOL 10 MG/ML IV BOLUS
INTRAVENOUS | Status: DC | PRN
  Administered 2022-02-04: 200 mg via INTRAVENOUS
  Administered 2022-02-04: 50 mg via INTRAVENOUS

## 2022-02-04 MED ORDER — AMISULPRIDE (ANTIEMETIC) 5 MG/2ML IV SOLN
INTRAVENOUS | Status: AC
  Filled 2022-02-04: qty 4

## 2022-02-04 MED ORDER — 0.9 % SODIUM CHLORIDE (POUR BTL) OPTIME
TOPICAL | Status: DC | PRN
  Administered 2022-02-04: 1000 mL

## 2022-02-04 MED ORDER — OXYCODONE HCL 5 MG/5ML PO SOLN: 5.0000 mg | Freq: Once | ORAL | Status: AC | PRN

## 2022-02-04 MED ORDER — OXYCODONE HCL 5 MG PO TABS
ORAL_TABLET | ORAL | Status: AC
  Filled 2022-02-04: qty 1

## 2022-02-04 SURGICAL SUPPLY — 65 items
BAG COUNTER SPONGE SURGICOUNT (BAG) ×2 IMPLANT
BNDG COHESIVE 1X5 TAN STRL LF (GAUZE/BANDAGES/DRESSINGS) IMPLANT
BNDG ELASTIC 3X5.8 VLCR STR LF (GAUZE/BANDAGES/DRESSINGS) IMPLANT
BNDG ELASTIC 4X5.8 VLCR STR LF (GAUZE/BANDAGES/DRESSINGS) ×1 IMPLANT
BNDG ESMARK 4X9 LF (GAUZE/BANDAGES/DRESSINGS) ×2 IMPLANT
BNDG GAUZE ELAST 4 BULKY (GAUZE/BANDAGES/DRESSINGS) ×1 IMPLANT
CAP PIN PROTECTOR ORTHO WHT (CAP) ×1 IMPLANT
CORD BIPOLAR FORCEPS 12FT (ELECTRODE) ×2 IMPLANT
COVER SURGICAL LIGHT HANDLE (MISCELLANEOUS) ×2 IMPLANT
CUFF TOURN SGL QUICK 18X4 (TOURNIQUET CUFF) ×2 IMPLANT
CUFF TOURN SGL QUICK 24 (TOURNIQUET CUFF)
CUFF TRNQT CYL 24X4X16.5-23 (TOURNIQUET CUFF) IMPLANT
DRAPE SURG 17X23 STRL (DRAPES) ×2 IMPLANT
DRSG ADAPTIC 3X8 NADH LF (GAUZE/BANDAGES/DRESSINGS) IMPLANT
DRSG XEROFORM 1X8 (GAUZE/BANDAGES/DRESSINGS) ×1 IMPLANT
GAUZE SPONGE 2X2 8PLY STRL LF (GAUZE/BANDAGES/DRESSINGS) IMPLANT
GAUZE SPONGE 4X4 12PLY STRL (GAUZE/BANDAGES/DRESSINGS) ×1 IMPLANT
GAUZE XEROFORM 1X8 LF (GAUZE/BANDAGES/DRESSINGS) ×2 IMPLANT
GLOVE BIOGEL PI IND STRL 8.5 (GLOVE) ×1 IMPLANT
GLOVE BIOGEL PI INDICATOR 8.5 (GLOVE) ×1
GLOVE SURG ORTHO 8.0 STRL STRW (GLOVE) ×2 IMPLANT
GOWN STRL REUS W/ TWL LRG LVL3 (GOWN DISPOSABLE) ×2 IMPLANT
GOWN STRL REUS W/ TWL XL LVL3 (GOWN DISPOSABLE) ×1 IMPLANT
GOWN STRL REUS W/TWL LRG LVL3 (GOWN DISPOSABLE) ×2
GOWN STRL REUS W/TWL XL LVL3 (GOWN DISPOSABLE) ×1
K-WIRE .045X4 (WIRE) ×2 IMPLANT
KIT BASIN OR (CUSTOM PROCEDURE TRAY) ×2 IMPLANT
KIT TURNOVER KIT B (KITS) ×2 IMPLANT
MANIFOLD NEPTUNE II (INSTRUMENTS) ×2 IMPLANT
NDL HYPO 25GX1X1/2 BEV (NEEDLE) IMPLANT
NEEDLE HYPO 25GX1X1/2 BEV (NEEDLE) IMPLANT
NS IRRIG 1000ML POUR BTL (IV SOLUTION) ×2 IMPLANT
PACK ORTHO EXTREMITY (CUSTOM PROCEDURE TRAY) ×2 IMPLANT
PAD ARMBOARD 7.5X6 YLW CONV (MISCELLANEOUS) ×4 IMPLANT
PAD CAST 4YDX4 CTTN HI CHSV (CAST SUPPLIES) IMPLANT
PADDING CAST COTTON 4X4 STRL (CAST SUPPLIES) ×2
SET CYSTO W/LG BORE CLAMP LF (SET/KITS/TRAYS/PACK) IMPLANT
SOAP 2 % CHG 4 OZ (WOUND CARE) ×2 IMPLANT
SPECIMEN JAR SMALL (MISCELLANEOUS) ×2 IMPLANT
SPLINT FIBERGLASS 4X30 (CAST SUPPLIES) ×1 IMPLANT
SPONGE GAUZE 2X2 STER 10/PKG (GAUZE/BANDAGES/DRESSINGS)
SUCTION FRAZIER HANDLE 10FR (MISCELLANEOUS)
SUCTION TUBE FRAZIER 10FR DISP (MISCELLANEOUS) IMPLANT
SUT ETHIBOND 4 0 TF (SUTURE) ×1 IMPLANT
SUT ETHILON 4 0 PS 2 18 (SUTURE) ×3 IMPLANT
SUT ETHILON 8 0 BV130 4 (SUTURE) IMPLANT
SUT ETHILON 9 0 BV130 4 (SUTURE) IMPLANT
SUT FIBER WIRE 4.0 (SUTURE) IMPLANT
SUT FIBERWIRE 2-0 18 17.9 3/8 (SUTURE)
SUT FIBERWIRE 3-0 18 TAPR NDL (SUTURE)
SUT MERSILENE 4 0 P 3 (SUTURE) IMPLANT
SUT PROLENE 4 0 PS 2 18 (SUTURE) IMPLANT
SUT PROLENE 5 0 PS 2 (SUTURE) IMPLANT
SUT PROLENE 6 0 P 1 18 (SUTURE) IMPLANT
SUT VIC AB 2-0 CT1 27 (SUTURE)
SUT VIC AB 2-0 CT1 TAPERPNT 27 (SUTURE) IMPLANT
SUTURE FIBERWR 2-0 18 17.9 3/8 (SUTURE) IMPLANT
SUTURE FIBERWR 3-0 18 TAPR NDL (SUTURE) IMPLANT
SYR CONTROL 10ML LL (SYRINGE) IMPLANT
TOWEL GREEN STERILE (TOWEL DISPOSABLE) ×2 IMPLANT
TOWEL GREEN STERILE FF (TOWEL DISPOSABLE) ×2 IMPLANT
TUBE CONNECTING 12X1/4 (SUCTIONS) IMPLANT
TUBE FEEDING ENTERAL 5FR 16IN (TUBING) IMPLANT
UNDERPAD 30X36 HEAVY ABSORB (UNDERPADS AND DIAPERS) ×2 IMPLANT
WATER STERILE IRR 1000ML POUR (IV SOLUTION) ×2 IMPLANT

## 2022-02-04 NOTE — Discharge Instructions (Signed)
Audria Nine, M.D. Hand Surgery  POST-OPERATIVE DISCHARGE INSTRUCTIONS   PRESCRIPTIONS: You may have been given a prescription to be taken as directed for post-operative pain control.  You may also take over the counter ibuprofen/aleve and tylenol for pain. Take this as directed on the packaging. Do not exceed 3000 mg tylenol/acetaminophen in 24 hours.  Ibuprofen 600-800 mg (3-4) tablets by mouth every 6 hours as needed for pain.  OR Aleve 2 tablets by mouth every 12 hours (twice daily) as needed for pain.  AND/OR Tylenol 1000 mg (2 tablets) every 8 hours as needed for pain.  Please use your pain medication carefully, as refills are limited and you may not be provided with one.  As stated above, please use over the counter pain medicine - it will also be helpful with decreasing your swelling.    ANESTHESIA: After your surgery, post-surgical discomfort or pain is likely. This discomfort can last several days to a few weeks. At certain times of the day your discomfort may be more intense.   Did you receive a nerve block?  A nerve block can provide pain relief for one hour to two days after your surgery. As long as the nerve block is working, you will experience little or no sensation in the area the surgeon operated on.  As the nerve block wears off, you will begin to experience pain or discomfort. It is very important that you begin taking your prescribed pain medication before the nerve block fully wears off. Treating your pain at the first sign of the block wearing off will ensure your pain is better controlled and more tolerable when full-sensation returns. Do not wait until the pain is intolerable, as the medicine will be less effective. It is better to treat pain in advance than to try and catch up.   General Anesthesia:  If you did not receive a nerve block during your surgery, you will need to start taking your pain medication shortly after your surgery and should continue  to do so as prescribed by your surgeon.     ICE AND ELEVATION: You may use ice for the first 48-72 hours, but it is not critical.   Elevation, as much as possible for the next 48 hours, is critical for decreasing swelling as well as for pain relief. Elevation means when you are seated or lying down, you hand should be at or above your heart. When walking, the hand needs to be at or above the level of your elbow.  If the bandage gets too tight, it may need to be loosened. Please contact our office and we will instruct you in how to do this.    SURGICAL BANDAGES:  Keep your dressing and/or splint clean and dry at all times.  Do not remove until you are seen again in the office.  If careful, you may place a plastic bag over your bandage and tape the end to shower, but be careful, do not get your bandages wet.     HAND THERAPY:  You may not need any. If you do, we will begin this at your follow up visit in the clinic.    ACTIVITY AND WORK: You are encouraged to move any fingers which are not in the bandage.  Light use of the fingers is allowed to assist the other hand with daily hygiene and eating, but strong gripping or lifting is often uncomfortable and should be avoided.  You might miss a variable period of time  from work and hopefully this issue has been discussed prior to surgery. You may not do any heavy work with your affected hand for about 2 weeks.    [facility] [address] [place],  [place]  [address] [phone]  

## 2022-02-04 NOTE — Transfer of Care (Signed)
Immediate Anesthesia Transfer of Care Note  Patient: Emily Mccormick  Procedure(s) Performed: LEFT THUMB REPAIR EXTENSOR TENDON, Percutaneous Pinning of Left Thumb Metacarpophalangeal Joint (Left: Thumb)  Patient Location: PACU  Anesthesia Type:General  Level of Consciousness: awake  Airway & Oxygen Therapy: Patient Spontanous Breathing and Patient connected to face mask oxygen  Post-op Assessment: Report given to RN and Post -op Vital signs reviewed and stable  Post vital signs: Reviewed and stable  Last Vitals:  Vitals Value Taken Time  BP 109/65 02/04/22 1347  Temp    Pulse 81 02/04/22 1348  Resp 16 02/04/22 1348  SpO2 100 % 02/04/22 1348  Vitals shown include unvalidated device data.  Last Pain:  Vitals:   02/04/22 1112  PainSc: 10-Worst pain ever      Patients Stated Pain Goal: 0 (02/04/22 1112)  Complications: No notable events documented.

## 2022-02-04 NOTE — Anesthesia Postprocedure Evaluation (Signed)
Anesthesia Post Note  Patient: Emily Mccormick  Procedure(s) Performed: LEFT THUMB REPAIR EXTENSOR TENDON, Percutaneous Pinning of Left Thumb Metacarpophalangeal Joint (Left: Thumb)     Patient location during evaluation: PACU Anesthesia Type: General Level of consciousness: awake and alert Pain management: pain level controlled Vital Signs Assessment: post-procedure vital signs reviewed and stable Respiratory status: spontaneous breathing, nonlabored ventilation and respiratory function stable Cardiovascular status: blood pressure returned to baseline and stable Postop Assessment: no apparent nausea or vomiting Anesthetic complications: no   No notable events documented.  Last Vitals:  Vitals:   02/04/22 1405 02/04/22 1420  BP: 107/64 109/63  Pulse: 64 64  Resp: 14 16  Temp:  36.7 C  SpO2: 98% 97%    Last Pain:  Vitals:   02/04/22 1420  PainSc: 4                  Lowella Curb

## 2022-02-04 NOTE — Interval H&P Note (Signed)
History and Physical Interval Note:  02/04/2022 11:54 AM  Emily Mccormick  has presented today for surgery, with the diagnosis of LEFT THUMB EXTENSOR TENDON LACERATION.  The various methods of treatment have been discussed with the patient and family. After consideration of risks, benefits and other options for treatment, the patient has consented to  Procedure(s): LEFT THUMB REPAIR EXTENSOR TENDON (Left) as a surgical intervention.  The patient's history has been reviewed, patient examined, no change in status, stable for surgery.  I have reviewed the patient's chart and labs.  Questions were answered to the patient's satisfaction.     Lessie Funderburke Kemisha Bonnette

## 2022-02-04 NOTE — Anesthesia Preprocedure Evaluation (Signed)
Anesthesia Evaluation  Patient identified by MRN, date of birth, ID band Patient awake    Reviewed: Allergy & Precautions, NPO status , Patient's Chart, lab work & pertinent test results  Airway Mallampati: II  TM Distance: >3 FB Neck ROM: Full    Dental no notable dental hx.    Pulmonary neg pulmonary ROS,    Pulmonary exam normal breath sounds clear to auscultation       Cardiovascular hypertension, negative cardio ROS Normal cardiovascular exam Rhythm:Regular Rate:Normal     Neuro/Psych negative neurological ROS  negative psych ROS   GI/Hepatic negative GI ROS, Neg liver ROS,   Endo/Other  negative endocrine ROS  Renal/GU negative Renal ROS  negative genitourinary   Musculoskeletal negative musculoskeletal ROS (+)   Abdominal   Peds negative pediatric ROS (+)  Hematology  (+) Blood dyscrasia, anemia ,   Anesthesia Other Findings   Reproductive/Obstetrics negative OB ROS                             Anesthesia Physical Anesthesia Plan  ASA: 2  Anesthesia Plan: General   Post-op Pain Management:    Induction: Intravenous  PONV Risk Score and Plan: 3 and Ondansetron, Dexamethasone, Midazolam and Treatment may vary due to age or medical condition  Airway Management Planned: LMA  Additional Equipment:   Intra-op Plan:   Post-operative Plan: Extubation in OR  Informed Consent: I have reviewed the patients History and Physical, chart, labs and discussed the procedure including the risks, benefits and alternatives for the proposed anesthesia with the patient or authorized representative who has indicated his/her understanding and acceptance.     Dental advisory given  Plan Discussed with: CRNA  Anesthesia Plan Comments:         Anesthesia Quick Evaluation
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
## 2022-02-04 NOTE — Brief Op Note (Signed)
02/04/2022  1:43 PM  PATIENT:  Emily Mccormick  36 y.o. female  PRE-OPERATIVE DIAGNOSIS:  LEFT THUMB EXTENSOR TENDON LACERATION  POST-OPERATIVE DIAGNOSIS:  LEFT THUMB EXTENSOR TENDON LACERATION  PROCEDURE:  Procedure(s): LEFT THUMB REPAIR EXTENSOR TENDON, Percutaneous Pinning of Left Thumb Metacarpophalangeal Joint (Left)  SURGEON:  Surgeon(s) and Role:    * Marlyne Beards, MD - Primary  PHYSICIAN ASSISTANT:   ASSISTANTS: none   ANESTHESIA:   general  EBL:  10 mL   BLOOD ADMINISTERED:none  DRAINS: none   LOCAL MEDICATIONS USED:  MARCAINE     SPECIMEN:  No Specimen  DISPOSITION OF SPECIMEN:  N/A  COUNTS:  YES  TOURNIQUET:   Total Tourniquet Time Documented: Upper Arm (Left) - 56 minutes Total: Upper Arm (Left) - 56 minutes   DICTATION: .Reubin Milan Dictation  PLAN OF CARE: Discharge to home after PACU  PATIENT DISPOSITION:  PACU - hemodynamically stable.   Delay start of Pharmacological VTE agent (>24hrs) due to surgical blood loss or risk of bleeding: not applicable

## 2022-02-04 NOTE — Anesthesia Procedure Notes (Signed)
Procedure Name: LMA Insertion Date/Time: 02/04/2022 12:09 PM Performed by: Erick Colace, CRNA Pre-anesthesia Checklist: Patient identified, Emergency Drugs available, Suction available and Patient being monitored Patient Re-evaluated:Patient Re-evaluated prior to induction Oxygen Delivery Method: Circle system utilized Preoxygenation: Pre-oxygenation with 100% oxygen Induction Type: IV induction Ventilation: Mask ventilation without difficulty LMA: LMA inserted LMA Size: 4.0 Laser Tube: Cuffed inflated with minimal occlusive pressure - saline Placement Confirmation: positive ETCO2 and breath sounds checked- equal and bilateral Tube secured with: Tape Dental Injury: Teeth and Oropharynx as per pre-operative assessment

## 2022-02-05 ENCOUNTER — Encounter (HOSPITAL_COMMUNITY): Payer: Self-pay | Admitting: Orthopedic Surgery

## 2022-02-13 ENCOUNTER — Ambulatory Visit (INDEPENDENT_AMBULATORY_CARE_PROVIDER_SITE_OTHER): Payer: Medicaid Other | Admitting: Orthopedic Surgery

## 2022-02-13 DIAGNOSIS — S66222A Laceration of extensor muscle, fascia and tendon of left thumb at wrist and hand level, initial encounter: Secondary | ICD-10-CM

## 2022-02-13 DIAGNOSIS — S61209A Unspecified open wound of unspecified finger without damage to nail, initial encounter: Secondary | ICD-10-CM

## 2022-02-13 DIAGNOSIS — S56429A Laceration of extensor muscle, fascia and tendon of unspecified finger at forearm level, initial encounter: Secondary | ICD-10-CM

## 2022-02-13 NOTE — Progress Notes (Signed)
   Post-Op Visit Note   Patient: Emily Mccormick           Date of Birth: November 09, 1985           MRN: [id number] Visit Date: 02/13/2022 PCP: Shelle Iron, MD   Assessment & Plan:  Chief Complaint:  Chief Complaint  Patient presents with   Left Thumb - Routine Post Op   Visit Diagnoses:  1. Extensor tendon laceration of finger with open wound, initial encounter   2. Laceration of extensor muscle, fascia and tendon of left thumb at wrist and hand level, initial encounter     Plan: Patient is now approximately 10 days s/p I&D and repair of left thumb EPL and EPB tendons at thumb extensor zone 3.  The repair was supplemented by a trans-articular k-wire across the MCP joint.  She is doing well postoperatively.  Her incision is healing well with no surrounding erythema or induration.  The pin site is clean and dry.  The pin site was cleaned with alcohol and redressed.  She was put back into a thumb spica split.  I can see her in 10-14 days after she sees hand therapy to remove the k-wire.   Follow-Up Instructions: No follow-ups on file.   Orders:  No orders of the defined types were placed in this encounter.  No orders of the defined types were placed in this encounter.   Imaging: No results found.  PMFS History: Patient Active Problem List   Diagnosis Date Noted   Extensor tendon laceration of finger with open wound    Laceration of extensor muscle, fascia and tendon of left thumb at wrist and hand level, initial encounter 01/30/2022   Past Medical History:  Diagnosis Date   Anemia    Hypertension    Seizure (HCC)    Seizures (HCC)    epilepsy    No family history on file.  Past Surgical History:  Procedure Laterality Date   CESAREAN SECTION     REPAIR EXTENSOR TENDON Left 02/04/2022   Procedure: LEFT THUMB REPAIR EXTENSOR TENDON, Percutaneous Pinning of Left Thumb Metacarpophalangeal Joint;  Surgeon: Marlyne Beards, MD;  Location: [facility] OR;  Service: Orthopedics;   Laterality: Left;   TUBAL LIGATION     Social History   Occupational History   Not on file  Tobacco Use   Smoking status: Never   Smokeless tobacco: Never  Substance and Sexual Activity   Alcohol use: No   Drug use: Not Currently    Types: Marijuana    Comment: last smoked 2 days ago   Sexual activity: Not on file

## 2022-02-20 ENCOUNTER — Ambulatory Visit (INDEPENDENT_AMBULATORY_CARE_PROVIDER_SITE_OTHER): Payer: Medicaid Other | Admitting: Orthopedic Surgery

## 2022-02-20 DIAGNOSIS — S61209A Unspecified open wound of unspecified finger without damage to nail, initial encounter: Secondary | ICD-10-CM

## 2022-02-20 DIAGNOSIS — S56429A Laceration of extensor muscle, fascia and tendon of unspecified finger at forearm level, initial encounter: Secondary | ICD-10-CM

## 2022-02-20 NOTE — Progress Notes (Signed)
   Post-Op Visit Note   Patient: Emily Mccormick           Date of Birth: 22-Aug-1986           MRN: [id number] Visit Date: 02/20/2022 PCP: Charlotte Sanes, MD   Assessment & Plan:  Chief Complaint:  Chief Complaint  Patient presents with   Left Thumb - Routine Post Op   Visit Diagnoses:  1. Extensor tendon laceration of finger with open wound, initial encounter     Plan: Patient is two weeks s/p EPL and EPB repair.  She presents today because she got her splint wet.  The incision has completely healed.  The pin site is clean and dry.  Once she sees therapy, she can see me back and I'll pull the k-wire.   Follow-Up Instructions: No follow-ups on file.   Orders:  Orders Placed This Encounter  Procedures   Ambulatory referral to Occupational Therapy   No orders of the defined types were placed in this encounter.   Imaging: No results found.  PMFS History: Patient Active Problem List   Diagnosis Date Noted   Extensor tendon laceration of finger with open wound    Laceration of extensor muscle, fascia and tendon of left thumb at wrist and hand level, initial encounter 01/30/2022   Past Medical History:  Diagnosis Date   Anemia    Hypertension    Seizure ([place])    Seizures ([place])    epilepsy    No family history on file.  Past Surgical History:  Procedure Laterality Date   CESAREAN SECTION     REPAIR EXTENSOR TENDON Left 02/04/2022   Procedure: LEFT THUMB REPAIR EXTENSOR TENDON, Percutaneous Pinning of Left Thumb Metacarpophalangeal Joint;  Surgeon: Sherilyn Cooter, MD;  Location: [facility];  Service: Orthopedics;  Laterality: Left;   TUBAL LIGATION     Social History   Occupational History   Not on file  Tobacco Use   Smoking status: Never   Smokeless tobacco: Never  Substance and Sexual Activity   Alcohol use: No   Drug use: Not Currently    Types: Marijuana    Comment: last smoked 2 days ago   Sexual activity: Not on file

## 2022-02-23 NOTE — Therapy (Signed)
OUTPATIENT OCCUPATIONAL THERAPY ORTHO EVALUATION  Patient Name: Emily Mccormick MRN: [id number] DOB:1986/01/12, 37 y.o., female Today's Date: 02/24/2022  PCP: Fernande Boyden PROVIDER: Marlyne Beards, MD    OT End of Session - 02/24/22 1236     Visit Number 1    Number of Visits 13    Date for OT Re-Evaluation 04/26/22    Authorization Type [place] [employer] - awaiting auth    OT Start Time 0930    OT Stop Time 1045    OT Time Calculation (min) 75 min    Activity Tolerance Patient tolerated treatment well    Behavior During Therapy WFL for tasks assessed/performed             Past Medical History:  Diagnosis Date   Anemia    Hypertension    Seizure ([facility])    Seizures ([facility])    epilepsy   Past Surgical History:  Procedure Laterality Date   CESAREAN SECTION     REPAIR EXTENSOR TENDON Left 02/04/2022   Procedure: LEFT THUMB REPAIR EXTENSOR TENDON, Percutaneous Pinning of Left Thumb Metacarpophalangeal Joint;  Surgeon: Marlyne Beards, MD;  Location: [facility];  Service: Orthopedics;  Laterality: Left;   TUBAL LIGATION     Patient Active Problem List   Diagnosis Date Noted   Extensor tendon laceration of finger with open wound    Laceration of extensor muscle, fascia and tendon of left thumb at wrist and hand level, initial encounter 01/30/2022    ONSET DATE: Date of Surgery: 02/04/2022   REFERRING DIAG: S56.429A,S61.209A (ICD-10-CM) - Extensor tendon laceration of finger with open wound, initial encounter  PROCEDURE:  Left thumb EPL repair in zone 3 Left thumb EPB repair in zone 3  THERAPY DIAG:  Pain in thumb joint with movement of left hand - Plan: Ot plan of care cert/re-cert  Stiffness of left hand, not elsewhere classified - Plan: Ot plan of care cert/re-cert  Muscle weakness (generalized) - Plan: Ot plan of care cert/re-cert  Localized edema - Plan: Ot plan of care cert/re-cert  Other lack of coordination - Plan: Ot plan of care  cert/re-cert  Rationale for Evaluation and Treatment Rehabilitation  SUBJECTIVE:   SUBJECTIVE STATEMENT:  Pt accompanied by: self  PERTINENT HISTORY: PROCEDURE: left thumb EPL and EPB repair in zone 3, Medical hx: Seizures, HTN  PRECAUTIONS: Other: PER EPL/EPB extensor tendon repair PROTOCOL  WEIGHT BEARING RESTRICTIONS Yes  non WB through Lt hand/wrist  PAIN:  Are you having pain? Yes: NPRS scale: 10/10 Pain location: Lt thumb Pain description: throbbing, stabbing Aggravating factors: hanging down, trying to move other fingers Relieving factors: elevation, meds  FALLS: Has patient fallen in last 6 months? 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  Dependent for IADLS at this time, on light duty at work   UPPER EXTREMITY ROM   Unable to assess due to current precautions Lt thumb, however finger ROM WFL's in splint, elbow and shoulder WNL's. RUE WNL'S    HAND FUNCTION: NT d/t precautions  COORDINATION: NT d/t precautions  SENSATION: Not tested  EDEMA: mild Lt thumb  COGNITION: Overall cognitive status: Within functional limits for tasks assessed  OBSERVATIONS: Pt arrived fully wrapped and protected from post  surgical dressing/splint. Pt has one pin site distal radial side of thumb   TODAY'S TREATMENT:  Post surgical dressings carefully removed, hand/forearm was cleaned prior to splint fabrication. Also cleaned base of pin w/ saline but instructed to use Q-tip w/ H2O2 at home to clean base of pin 2x/day. Covered pin w/ minimal padding prior to splint fabrication including xeroform at base, and gauze over pin.   Fabricated and fitted thumb spica  splint w/ slight wrist extension and thumb positioned b/t radial and palmer abduction per both protocols, w/ IP joint included per protocol for EPL repair. Issued splint and reviewed wear and care and current precautions.   Pt only 1 day shy of 3 week post op protocol therefore issued gentle A/ROM for wrist flex/ext with thumb relaxed per 3 week post op protocol. Pt also instructed to keep digits 2-5 moving while in splint only   PATIENT EDUCATION: Education details: splint wear and care, hand hygiene and pin site care, precautions, initial HEP for wrist A/ROM Person educated: Patient Education method: Programmer, multimediaxplanation, Demonstration, and Handouts Education comprehension: verbalized understanding   HOME EXERCISE PROGRAM: 02/24/22: splint wear and care, precautions, hygiene care, wrist A/ROM  GOALS:  SHORT TERM GOALS: Target date: 03/26/2022    Independent with splint wear and care Baseline: issued, may need adjustments Goal status: IN PROGRESS  2.  Independent with initial HEP  Baseline: issued for wrist, will need updates for thumb Goal status: IN PROGRESS  3.  Pt will demo 50% or greater Lt thumb ROM in prep for functional tasks Baseline: Unable to assess due to current precautions  Goal status: INITIAL  4.  Pain Lt thumb with gentle A/ROM 5/10 or under  Baseline: 10/10 Goal status: INITIAL   LONG TERM GOALS: Target date: 04/26/22    Independent with updated strengthening HEP for Lt hand/thumb Baseline: Unable to assess due to current precautions  Goal status: INITIAL  2.  Grip strength Lt hand to be 30 lbs or greater Baseline: Unable to assess due to current precautions  Goal status: INITIAL  3.  Pt to return to using Lt hand to assist with ADLS and bilateral tasks Baseline: Unable to assess due to current precautions  Goal status: INITIAL  4.  Pt to have full Lt thumb ROM Baseline: Unable to assess due to current precautions  Goal status:  INITIAL   ASSESSMENT:  CLINICAL IMPRESSION: Patient is a 36 y.o. female who was seen today for occupational therapy evaluation for splinting and initiation of therapy s/p EPL and EPB extensor tendon repairs Lt thumb on 02/04/22. Pt presents today with pain, stiffness, edema, and inability to use Lt non dominant hand d/t current precautions. Pt will benefit from skilled O.T. to address these deficits and return pt to PLOF  PERFORMANCE DEFICITS in functional skills including ADLs, IADLs, coordination, dexterity, sensation, edema, ROM, strength, pain, FMC, decreased knowledge of precautions, wound, skin integrity, and UE functional use,   IMPAIRMENTS are limiting patient from ADLs, IADLs, work, and social participation.   COMORBIDITIES may have co-morbidities  that affects occupational performance. Patient will benefit from skilled OT to address above impairments and improve overall function.  MODIFICATION OR ASSISTANCE TO COMPLETE EVALUATION: Min-Moderate modification of tasks or assist with assess necessary to complete an evaluation.  OT OCCUPATIONAL PROFILE AND HISTORY: Problem focused assessment: Including review of records relating to presenting problem.  CLINICAL DECISION MAKING: LOW - limited treatment options, no task modification necessary  REHAB POTENTIAL: Good  EVALUATION COMPLEXITY: Low  PLAN: OT FREQUENCY: 2x/week  OT DURATION: 8 weeks  PLANNED INTERVENTIONS: self care/ADL training, therapeutic exercise, therapeutic activity, manual therapy, scar mobilization, passive range of motion, splinting, electrical stimulation, ultrasound, paraffin, fluidotherapy, compression bandaging, moist heat, cryotherapy, contrast bath, patient/family education, coping strategies training, DME and/or AE instructions, and Re-evaluation  RECOMMENDED OTHER SERVICES: NONE  CONSULTED AND AGREED WITH PLAN OF CARE: Patient  PLAN FOR NEXT SESSION: splint adjustments prn, progress per 4 week  post-op protocol on 03/04/22. (Pt to go on vacation for 2 weeks after 03/04/22 visit)  Managed medicaid CPT codes: 34742- Therapeutic Exercise, 97140 - Manual Therapy, 97530 - Therapeutic Activities, 97535 - Self Care, [phone] - Electrical stimulation (unattended), [phone] - Ultrasound, [phone] - Orthotic Fit, [id number] - Fluidotherapy, [phone] - Contrast bath, and [id number] -  Paraffin    Sheran Lawless, OT 02/24/2022, 1:05 PM

## 2022-02-24 ENCOUNTER — Ambulatory Visit: Payer: Medicaid Other | Attending: Orthopedic Surgery | Admitting: Occupational Therapy

## 2022-02-24 ENCOUNTER — Encounter: Payer: Self-pay | Admitting: Occupational Therapy

## 2022-02-24 DIAGNOSIS — M25542 Pain in joints of left hand: Secondary | ICD-10-CM | POA: Insufficient documentation

## 2022-02-24 DIAGNOSIS — M25642 Stiffness of left hand, not elsewhere classified: Secondary | ICD-10-CM | POA: Insufficient documentation

## 2022-02-24 DIAGNOSIS — R278 Other lack of coordination: Secondary | ICD-10-CM | POA: Diagnosis present

## 2022-02-24 DIAGNOSIS — R600 Localized edema: Secondary | ICD-10-CM | POA: Insufficient documentation

## 2022-02-24 DIAGNOSIS — M6281 Muscle weakness (generalized): Secondary | ICD-10-CM | POA: Diagnosis present

## 2022-02-24 NOTE — Patient Instructions (Signed)
WEARING SCHEDULE:  Wear splint at ALL times except for hygiene care (May remove splint for exercises and then immediately place back on ONLY if directed by the therapist)  PURPOSE:  To prevent movement and for protection until injury can heal  CARE OF SPLINT:  Keep splint away from heat sources including: stove, radiator or furnace, or a car in sunlight. The splint can melt and will no longer fit you properly  Keep away from pets and children  Clean the splint with rubbing alcohol 1-2 times per day.  * During this time, make sure you also clean your hand/arm as instructed by your therapist and/or perform dressing changes as needed. Then dry hand/arm completely before replacing splint. (When cleaning hand/arm, keep it immobilized in same position until splint is replaced)  PRECAUTIONS/POTENTIAL PROBLEMS: *If you notice or experience increased pain, swelling, numbness, or a lingering reddened area from the splint: Contact your therapist immediately by calling [phone]. You must wear the splint for protection, but we will get you scheduled for adjustments as quickly as possible.  (If only straps or hooks need to be replaced and NO adjustments to the splint need to be made, just call the office ahead and let them know you are coming in)  If you have any medical concerns or signs of infection, please call your doctor immediately   Wrist Flexion / Extension    Hold elbows at 90 and close to body, with palms down. Bend  wrist so fingers point up. Then bend wrist so fingers point down. KEEP THUMB RELAXED - DO NOT MOVE THUMB Repeat sequence _10___ times per session. Do __3__ sessions per day.

## 2022-02-25 ENCOUNTER — Telehealth: Payer: Self-pay | Admitting: Occupational Therapy

## 2022-02-25 NOTE — Telephone Encounter (Signed)
Patient called our front office this morning reporting pain w/ splint (in palm at thumb) and wanted to come in for adjustments. Pt was offered our only openings at 2:45 today or 9:30 tomorrow, however pt could not come during either of those times. Therapist called back this afternoon at 1:30 to discuss options until adjustments could be made to splint including having family member flare splint away from thumb and placing folded gauze or moleskin under splint to prevent blisters. Therapist also offered additional time tomorrow at lunch to adjust splint if pt can come.  Pt will call in the morning if she can come tomorrow either at 9:30 or 12:00.  Pt sees orthopedic doctor on Friday and perhaps they can make adjustments to splint if needed at that time. Next scheduled appointment for therapy is 03/04/22 at 7:15, which is our earliest available appointment.

## 2022-02-27 ENCOUNTER — Ambulatory Visit (INDEPENDENT_AMBULATORY_CARE_PROVIDER_SITE_OTHER): Payer: Medicaid Other | Admitting: Orthopedic Surgery

## 2022-02-27 ENCOUNTER — Encounter: Payer: Self-pay | Admitting: Orthopedic Surgery

## 2022-02-27 DIAGNOSIS — S61209A Unspecified open wound of unspecified finger without damage to nail, initial encounter: Secondary | ICD-10-CM

## 2022-02-27 DIAGNOSIS — S56429A Laceration of extensor muscle, fascia and tendon of unspecified finger at forearm level, initial encounter: Secondary | ICD-10-CM

## 2022-02-27 DIAGNOSIS — S66222A Laceration of extensor muscle, fascia and tendon of left thumb at wrist and hand level, initial encounter: Secondary | ICD-10-CM

## 2022-02-27 NOTE — Progress Notes (Signed)
   Post-Op Visit Note   Patient: Emily Mccormick           Date of Birth: March 03, 1986           MRN: [id number] Visit Date: 02/27/2022 PCP: Shelle Iron, MD   Assessment & Plan:  Chief Complaint:  Chief Complaint  Patient presents with   Left Thumb - Routine Post Op   Visit Diagnoses:  1. Extensor tendon laceration of finger with open wound, initial encounter   2. Laceration of extensor muscle, fascia and tendon of left thumb at wrist and hand level, initial encounter     Plan: Patient is 3 weeks s/p left thumb EPL and EPB repairs at level of MCP joint (zone 3).  She was recently seen by therapy and a thermoplast splint was made.  Her incision remains well healed w/ no surrounding erythema or induration. The k-wire was removed today.  I can see her back in another few weeks after working with therapy.    Follow-Up Instructions: No follow-ups on file.   Orders:  No orders of the defined types were placed in this encounter.  No orders of the defined types were placed in this encounter.   Imaging: No results found.  PMFS History: Patient Active Problem List   Diagnosis Date Noted   Extensor tendon laceration of finger with open wound    Laceration of extensor muscle, fascia and tendon of left thumb at wrist and hand level, initial encounter 01/30/2022   Past Medical History:  Diagnosis Date   Anemia    Hypertension    Seizure ([facility])    Seizures ([facility])    epilepsy    History reviewed. No pertinent family history.  Past Surgical History:  Procedure Laterality Date   CESAREAN SECTION     REPAIR EXTENSOR TENDON Left 02/04/2022   Procedure: LEFT THUMB REPAIR EXTENSOR TENDON, Percutaneous Pinning of Left Thumb Metacarpophalangeal Joint;  Surgeon: Marlyne Beards, MD;  Location: [facility];  Service: Orthopedics;  Laterality: Left;   TUBAL LIGATION     Social History   Occupational History   Not on file  Tobacco Use   Smoking status: Never   Smokeless tobacco: Never   Substance and Sexual Activity   Alcohol use: No   Drug use: Not Currently    Types: Marijuana    Comment: last smoked 2 days ago   Sexual activity: Not on file

## 2022-03-04 ENCOUNTER — Ambulatory Visit: Payer: Medicaid Other | Admitting: Occupational Therapy

## 2022-03-04 ENCOUNTER — Encounter: Payer: Self-pay | Admitting: Occupational Therapy

## 2022-03-04 DIAGNOSIS — M25542 Pain in joints of left hand: Secondary | ICD-10-CM

## 2022-03-04 DIAGNOSIS — M6281 Muscle weakness (generalized): Secondary | ICD-10-CM

## 2022-03-04 DIAGNOSIS — R278 Other lack of coordination: Secondary | ICD-10-CM

## 2022-03-04 DIAGNOSIS — M25642 Stiffness of left hand, not elsewhere classified: Secondary | ICD-10-CM

## 2022-03-04 DIAGNOSIS — R600 Localized edema: Secondary | ICD-10-CM

## 2022-03-04 NOTE — Patient Instructions (Addendum)
Wear splint at all times when not exercising for protection, do not lift or grip anything      AROM: Wrist Extension   .  With _left ___ palm down, bend wrist up.perform 50% range for first 3-4 days then progress to full ROM as tolerated Repeat __10_ times per set.  Do __4-6__ sessions per day.        Opposition (Active)   Touch tip of thumb to nail tip of index finger for 3-4 days, progress to touching all fingers after 4-5 days making an "O" shape. Repeat __10__ times. Do _4-6___ sessions per day.   MP Flexion (Active)   Bend thumb to touch base of little finger, keeping tip joint straight. Perform 50% of motion for 3-4 days then increase to full motion Repeat __10-15__ times. Do _4-6___ sessions per day.        Thumb Circles    Move right thumb in a circle. Repeat _10___ times each direction per session.  4-6 x day, gentle  50% range of motion for 3-4 days then full motion as tolerated Hand Variation: Palm up   Copyright  VHI. All rights reserved.  AROM: Thumb Abduction / Adduction   Copyright  VHI. All rights reserved.     Composite Extension (Active)   Bring thumb up and out  and down gently 50% ROM for 3-4 days in hitchhiker position.  Repeat __10-15__ times. Do _4-6___ sessions per day.                 Starting week of 6/28- Copyright  VHI. All rights reserved.  Wrist Passive Flexion    Rest right forearm on table, hand palm-down over edge. Bend wrist by pressing hand down with other hand. Hold _5___ seconds. Repeat __10__ times. Do __4__ sessions per day.  [email or web address].[email or web address]         Start week of 7/5  PROM: IP Flexion (Passive)    Bend only  tip of thumb using thumb and forefinger of other hand. Hold __5__ seconds. Repeat ___5-10_ times. Do _4___ sessions per day. MP Flexion (Passive)    Use other hand to bend base joint of thumb. Hold _5___ seconds. Repeat __10__ times. Do __4__ sessions per  day. Activity: Tuck thumb under all fingers.*  Copyright  VHI. All rights reserved.

## 2022-03-04 NOTE — Therapy (Addendum)
OUTPATIENT OCCUPATIONAL THERAPY TREATMENT  Patient Name: Emily Mccormick MRN: [id number] DOB:1986/02/21, 36 y.o., female Today's Date: 03/04/2022  PCP: Fernande Boyden PROVIDER: Marlyne Beards, MD    OT End of Session - 03/04/22 1032     Visit Number 2    Number of Visits 13    Date for OT Re-Evaluation 04/26/22    Authorization Type [place] [employer] -   Authorization Time Period 3 visits 03/04/22-08/31/22    Authorization - Visit Number 1    Authorization - Number of Visits 3    OT Start Time 0717    OT Stop Time 0808    OT Time Calculation (min) 51 min             Past Medical History:  Diagnosis Date   Anemia    Hypertension    Seizure ([facility])    Seizures ([facility])    epilepsy   Past Surgical History:  Procedure Laterality Date   CESAREAN SECTION     REPAIR EXTENSOR TENDON Left 02/04/2022   Procedure: LEFT THUMB REPAIR EXTENSOR TENDON, Percutaneous Pinning of Left Thumb Metacarpophalangeal Joint;  Surgeon: Marlyne Beards, MD;  Location: [facility];  Service: Orthopedics;  Laterality: Left;   TUBAL LIGATION     Patient Active Problem List   Diagnosis Date Noted   Extensor tendon laceration of finger with open wound    Laceration of extensor muscle, fascia and tendon of left thumb at wrist and hand level, initial encounter 01/30/2022    ONSET DATE: Date of Surgery: 02/04/2022   REFERRING DIAG: S56.429A,S61.209A (ICD-10-CM) - Extensor tendon laceration of finger with open wound, initial encounter  PROCEDURE:  Left thumb EPL repair in zone 3 Left thumb EPB repair in zone 3  THERAPY DIAG:  Pain in thumb joint with movement of left hand  Stiffness of left hand, not elsewhere classified  Muscle weakness (generalized)  Localized edema  Other lack of coordination  Rationale for Evaluation and Treatment Rehabilitation  SUBJECTIVE:   SUBJECTIVE STATEMENT:  Pt accompanied by: self  PERTINENT HISTORY: PROCEDURE: left thumb EPL and EPB repair in zone  3, Medical hx: Seizures, HTN  PRECAUTIONS: Other: PER EPL/EPB extensor tendon repair PROTOCOL  WEIGHT BEARING RESTRICTIONS Yes  non WB through Lt hand/wrist  PAIN:  Pain in thumb 4/10 movement aggravates, rest and heat alleviates  FALLS: Has patient fallen in last 6 months? 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 Pt reports splint is rubbing her at palm. Adjustments performed to splint for improved comfort and fit and new strapping applied. Pt's pin has been removed  and pin site is healing, no s/s of infection and stockinette was applied to thumb and forearm/ hand. Pt reports improved comfort and fit following adjustments. Pt was instructed in 4 week post op exercises for thumb and wrist, pt returned demonstration . Pt was instructed to perform only 50%(midrange arc ) for initial 3-4 days then she can increase ROM. Hotpack was applied to thumb and wrist x 8 for pain and stiffness, no adverse reactions (pt was instructed that she can apply hotpack at home for 8-10 mins prior to exercises) Pt was provided with handouts and verbally educated in 5 weeks post op(begin 6/28) and 6 weeks postop exercises(begin 7/5) and instructed when to start  them and therapist demonstrated but pt did not perform due to precautions.     PATIENT EDUCATION: Education details: splint wear and precautions, 4 weeks post op exercises, ( 5 and 6 weeks post op provided and pt was instructed  and handouts provided but she did not perform) Person educated: Patient Education method: Explanation, Demonstration, and Handouts, verbal cues  Education comprehension: verbalized understanding, returned demo-4 week exercises only   HOME EXERCISE PROGRAM: 02/24/22: splint wear and  care, precautions, hygiene care, wrist A/ROM  GOALS:  SHORT TERM GOALS: Target date: 03/26/2022    Independent with splint wear and care Baseline: issued, may need adjustments Goal status: IN PROGRESS  2.  Independent with initial HEP  Baseline: issued for wrist, will need updates for thumb Goal status: IN PROGRESS  3.  Pt will demo 50% or greater Lt thumb ROM in prep for functional tasks Baseline: Unable to assess due to current precautions  Goal status: INITIAL  4.  Pain Lt thumb with gentle A/ROM 5/10 or under  Baseline: 10/10 Goal status: INITIAL   LONG TERM GOALS: Target date: 04/26/22    Independent with updated strengthening HEP for Lt hand/thumb Baseline: Unable to assess due to current precautions  Goal status: INITIAL  2.  Grip strength Lt hand to be 30 lbs or greater Baseline: Unable to assess due to current precautions  Goal status: INITIAL  3.  Pt to return to using Lt hand to assist with ADLS and bilateral tasks Baseline: Unable to assess due to current precautions  Goal status: INITIAL  4.  Pt to have full Lt thumb ROM Baseline: Unable to assess due to current precautions  Goal status: INITIAL   ASSESSMENT:  CLINICAL IMPRESSION: Pt is progressing towards goals. She demonstrates understanding of HEP for 4 weeks post op and returned demonstration.  PERFORMANCE DEFICITS in functional skills including ADLs, IADLs, coordination, dexterity, sensation, edema, ROM, strength, pain, FMC, decreased knowledge of precautions, wound, skin integrity, and UE functional use,   IMPAIRMENTS are limiting patient from ADLs, IADLs, work, and social participation.   COMORBIDITIES may have co-morbidities  that affects occupational performance. Patient will benefit from skilled OT to address above impairments and improve overall function.  MODIFICATION OR ASSISTANCE TO COMPLETE EVALUATION: Min-Moderate modification of tasks or assist with assess necessary to complete an  evaluation.  OT OCCUPATIONAL PROFILE AND HISTORY: Problem focused assessment: Including review of records relating to presenting problem.  CLINICAL DECISION MAKING: LOW - limited treatment options, no task modification necessary  REHAB POTENTIAL: Good  EVALUATION COMPLEXITY: Low      PLAN: OT FREQUENCY: 2x/week  OT DURATION: 8 weeks  PLANNED INTERVENTIONS: self care/ADL training, therapeutic exercise, therapeutic activity, manual therapy, scar mobilization, passive range of motion, splinting, electrical stimulation, ultrasound, paraffin, fluidotherapy, compression bandaging, moist heat, cryotherapy, contrast bath, patient/family education, coping strategies training, DME and/or AE instructions, and Re-evaluation  RECOMMENDED OTHER SERVICES: NONE  CONSULTED AND AGREED WITH PLAN OF CARE: Patient  PLAN FOR NEXT SESSION: progress per protocol, pt was issued 4 week post op exercises today. Therapist demonstrated additional exercises for pt to perform at 5 weeks post op, and 6 weeks post op as pt will be on vacation, handouts provided. Pt was instructed to perform gently and to let pain be her guide when she starts performing these exercises.  Pt was instructed NOT to begin these exercises sooner than the scheduled time. Pt was instructed to wear splint for protection when she is not exercising  Managed medicaid CPT codes: [phone]- Therapeutic Exercise, 97140 - Manual Therapy, 97530 - Therapeutic Activities, [phone] - Self Care, [phone] - Electrical stimulation (unattended), [id number] - Ultrasound, [place], [place], [id number] - Contrast bath, and [id number] -  Paraffin    Equilla Que, OT 03/04/2022, 11:16 AM

## 2022-03-24 ENCOUNTER — Ambulatory Visit: Payer: Medicaid Other | Admitting: Orthopedic Surgery

## 2022-03-24 ENCOUNTER — Ambulatory Visit: Payer: Medicaid Other | Admitting: Occupational Therapy

## 2022-03-26 ENCOUNTER — Ambulatory Visit: Payer: Medicaid Other | Attending: Orthopedic Surgery | Admitting: Occupational Therapy

## 2022-03-26 DIAGNOSIS — M25542 Pain in joints of left hand: Secondary | ICD-10-CM | POA: Insufficient documentation

## 2022-03-26 DIAGNOSIS — M6281 Muscle weakness (generalized): Secondary | ICD-10-CM | POA: Insufficient documentation

## 2022-03-26 DIAGNOSIS — M25642 Stiffness of left hand, not elsewhere classified: Secondary | ICD-10-CM | POA: Insufficient documentation

## 2022-03-26 DIAGNOSIS — R600 Localized edema: Secondary | ICD-10-CM | POA: Insufficient documentation

## 2022-03-26 DIAGNOSIS — R278 Other lack of coordination: Secondary | ICD-10-CM | POA: Insufficient documentation

## 2022-03-30 ENCOUNTER — Ambulatory Visit: Payer: Medicaid Other | Admitting: Orthopedic Surgery

## 2022-03-31 ENCOUNTER — Encounter: Payer: Medicaid Other | Admitting: Occupational Therapy

## 2022-04-02 ENCOUNTER — Ambulatory Visit: Payer: Medicaid Other | Admitting: Occupational Therapy

## 2022-04-07 ENCOUNTER — Encounter: Payer: Self-pay | Admitting: Occupational Therapy

## 2022-04-07 ENCOUNTER — Ambulatory Visit: Payer: Medicaid Other | Admitting: Occupational Therapy

## 2022-04-07 ENCOUNTER — Ambulatory Visit: Payer: Medicaid Other | Admitting: Orthopedic Surgery

## 2022-04-07 DIAGNOSIS — M6281 Muscle weakness (generalized): Secondary | ICD-10-CM | POA: Diagnosis present

## 2022-04-07 DIAGNOSIS — R278 Other lack of coordination: Secondary | ICD-10-CM

## 2022-04-07 DIAGNOSIS — R600 Localized edema: Secondary | ICD-10-CM

## 2022-04-07 DIAGNOSIS — M25642 Stiffness of left hand, not elsewhere classified: Secondary | ICD-10-CM

## 2022-04-07 DIAGNOSIS — M25542 Pain in joints of left hand: Secondary | ICD-10-CM | POA: Diagnosis not present

## 2022-04-07 NOTE — Patient Instructions (Signed)
1. Grip Strengthening (Resistive Putty)   Squeeze GREEN putty using thumb and all fingers. Repeat _20___ times. Do __2__ sessions per day.   2. Roll RED putty into tube on table and pinch between first two fingers and thumb x 10 reps. Do sessions per day   3. Lateral Pinch Strengthening (Resistive Putty)    Squeeze RED putty between thumb and side of index finger Repeat __10__ times. Do _2___ sessions per day.

## 2022-04-07 NOTE — Therapy (Signed)
OUTPATIENT OCCUPATIONAL THERAPY TREATMENT  Patient Name: Emily Mccormick MRN: [id number] DOB:11/14/1985, 36 y.o., female Today's Date: 04/07/2022  PCP: Jackey Loge PROVIDER: Sherilyn Cooter, MD    OT End of Session - 04/07/22 [phone]     Visit Number 3    Number of Visits 13    Date for OT Re-Evaluation 04/26/22    Authorization Type [place] [employer] - awaiting auth    Authorization Time Period 3 visits 03/04/22-08/31/22    Authorization - Visit Number 2    Authorization - Number of Visits 3    OT Start Time 0933    OT Stop Time 1005    OT Time Calculation (min) 32 min    Activity Tolerance Patient tolerated treatment well    Behavior During Therapy WFL for tasks assessed/performed                 Past Medical History:  Diagnosis Date   Anemia    Hypertension    Seizure ([place])    Seizures ([place])    epilepsy   Past Surgical History:  Procedure Laterality Date   CESAREAN SECTION     REPAIR EXTENSOR TENDON Left 02/04/2022   Procedure: LEFT THUMB REPAIR EXTENSOR TENDON, Percutaneous Pinning of Left Thumb Metacarpophalangeal Joint;  Surgeon: Sherilyn Cooter, MD;  Location: [place];  Service: Orthopedics;  Laterality: Left;   TUBAL LIGATION     Patient Active Problem List   Diagnosis Date Noted   Extensor tendon laceration of finger with open wound    Laceration of extensor muscle, fascia and tendon of left thumb at wrist and hand level, initial encounter 01/30/2022    ONSET DATE: Date of Surgery: 02/04/2022   REFERRING DIAG: S56.429A,S61.209A (ICD-10-CM) - Extensor tendon laceration of finger with open wound, initial encounter  PROCEDURE:  Left thumb EPL repair in zone 3 Left thumb EPB repair in zone 3  THERAPY DIAG:  Pain in thumb joint with movement of left hand  Stiffness of left hand, not elsewhere classified  Muscle weakness (generalized)  Localized edema  Other lack of coordination  Rationale for Evaluation and Treatment  Rehabilitation  SUBJECTIVE:   SUBJECTIVE STATEMENT:  Pt accompanied by: self  PERTINENT HISTORY: PROCEDURE: left thumb EPL and EPB repair in zone 3, Medical hx: Seizures, HTN  PRECAUTIONS: Other: PER EPL/EPB extensor tendon repair PROTOCOL  WEIGHT BEARING RESTRICTIONS Yes  non WB through Lt hand/wrist  PAIN:  Pain in thumb 1/10 movement aggravates, rest and heat alleviates   PLOF: Independent, 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  Grip strength: Lt = 76.7 lbs (Rt = 96.5 lbs)  Lateral pinch: Lt = 12 (Rt = 20)  3 tip pinch: Lt = 10 (Rt = 19)  Thumb ROM WFL's except tightness w/ MP flexion Lt side.   Pt issued putty HEP (green resistance for hand, red resistance for lateral and tip pinch) - see pt instructions for details.    PATIENT EDUCATION: Education details: putty HEP  Person educated: Patient Education method: Consulting civil engineer, Demonstration, and Handouts Education comprehension: verbalized understanding and returned demonstration     HOME EXERCISE PROGRAM: 02/24/22: splint wear and care, precautions, hygiene care, wrist A/ROM 04/07/22: Putty HEP   GOALS:  SHORT TERM GOALS: Target date: 03/26/2022    Independent with splint wear  and care Baseline: issued, may need adjustments Goal status: MET  2.  Independent with initial HEP  Baseline: issued for wrist, will need updates for thumb Goal status: MET  3.  Pt will demo 50% or greater Lt thumb ROM in prep for functional tasks Baseline: Unable to assess due to current precautions  Goal status: MET  4.  Pain Lt thumb with gentle A/ROM 5/10 or under  Baseline: 10/10 Goal status: MET   LONG TERM GOALS: Target date: 04/26/22    Independent with updated strengthening HEP for Lt hand/thumb Baseline: Unable  to assess due to current precautions  Goal status: IN PROGRESS  2.  Grip strength Lt hand to be 30 lbs or greater Baseline: Unable to assess due to current precautions  Goal status: MET (76 LBS)   3.  Pt to return to using Lt hand to assist with ADLS and bilateral tasks Baseline: Unable to assess due to current precautions  Goal status: IN PROGRESS  4.  Pt to have full Lt thumb ROM Baseline: Unable to assess due to current precautions  Goal status: MET w/ exception to MP flexion tightness   ASSESSMENT:  CLINICAL IMPRESSION: Pt will be 9 weeks post-op tomorrow. Pt has met all STG's and most LTG's at this time. Pt's grip strength is WFL's, however pinch is a little weaker  PERFORMANCE DEFICITS in functional skills including ADLs, IADLs, coordination, dexterity, sensation, edema, ROM, strength, pain, FMC, decreased knowledge of precautions, wound, skin integrity, and UE functional use,   IMPAIRMENTS are limiting patient from ADLs, IADLs, work, and social participation.   COMORBIDITIES may have co-morbidities  that affects occupational performance. Patient will benefit from skilled OT to address above impairments and improve overall function.  MODIFICATION OR ASSISTANCE TO COMPLETE EVALUATION: Min-Moderate modification of tasks or assist with assess necessary to complete an evaluation.  OT OCCUPATIONAL PROFILE AND HISTORY: Problem focused assessment: Including review of records relating to presenting problem.  CLINICAL DECISION MAKING: LOW - limited treatment options, no task modification necessary  REHAB POTENTIAL: Good  EVALUATION COMPLEXITY: Low      PLAN: OT FREQUENCY: 2x/week  OT DURATION: 8 weeks  PLANNED INTERVENTIONS: self care/ADL training, therapeutic exercise, therapeutic activity, manual therapy, scar mobilization, passive range of motion, splinting, electrical stimulation, ultrasound, paraffin, fluidotherapy, compression bandaging, moist heat, cryotherapy,  contrast bath, patient/family education, coping strategies training, DME and/or AE instructions, and Re-evaluation  RECOMMENDED OTHER SERVICES: NONE  CONSULTED AND AGREED WITH PLAN OF CARE: Patient  PLAN FOR NEXT SESSION: continue strengthening, reassess lateral and 3 tip pinch strength, anticipate d/c next session    Hans Eden, OT 04/07/2022, 10:08 AM

## 2022-04-09 ENCOUNTER — Ambulatory Visit: Payer: Medicaid Other | Admitting: Occupational Therapy

## 2022-04-09 ENCOUNTER — Ambulatory Visit (INDEPENDENT_AMBULATORY_CARE_PROVIDER_SITE_OTHER): Payer: Medicaid Other | Admitting: Orthopedic Surgery

## 2022-04-09 DIAGNOSIS — S66222A Laceration of extensor muscle, fascia and tendon of left thumb at wrist and hand level, initial encounter: Secondary | ICD-10-CM

## 2022-04-09 DIAGNOSIS — S56429A Laceration of extensor muscle, fascia and tendon of unspecified finger at forearm level, initial encounter: Secondary | ICD-10-CM

## 2022-04-09 DIAGNOSIS — S61209A Unspecified open wound of unspecified finger without damage to nail, initial encounter: Secondary | ICD-10-CM

## 2022-04-09 NOTE — Progress Notes (Signed)
   Post-Op Visit Note   Patient: Emily Mccormick           Date of Birth: [birth year]           MRN: [id number] Visit Date: 04/09/2022 PCP: Shelle Iron, MD   Assessment & Plan:  Chief Complaint:  Chief Complaint  Patient presents with   Left Thumb - Follow-up   Visit Diagnoses:  1. Laceration of extensor muscle, fascia and tendon of left thumb at wrist and hand level, initial encounter   2. Extensor tendon laceration of finger with open wound, initial encounter     Plan: Patient is now 9 weeks s/p repair of open EPL and EPB tendon repairs over the MCP joint (zone 3).  She is doing well.  She has been working hard with therapy.  She has full ROM of the IP joint.  She lacks a small degree of flexion of the MCP joint but has full extension.  She is able to oppose the thumb to the level of the small finger MCPJ.  She can see me again in another month for her last visit.   Follow-Up Instructions: No follow-ups on file.   Orders:  No orders of the defined types were placed in this encounter.  No orders of the defined types were placed in this encounter.   Imaging: No results found.  PMFS History: Patient Active Problem List   Diagnosis Date Noted   Extensor tendon laceration of finger with open wound    Laceration of extensor muscle, fascia and tendon of left thumb at wrist and hand level, initial encounter 01/30/2022   Past Medical History:  Diagnosis Date   Anemia    Hypertension    Seizure ([facility])    Seizures ([facility])    epilepsy    No family history on file.  Past Surgical History:  Procedure Laterality Date   CESAREAN SECTION     REPAIR EXTENSOR TENDON Left 02/04/2022   Procedure: LEFT THUMB REPAIR EXTENSOR TENDON, Percutaneous Pinning of Left Thumb Metacarpophalangeal Joint;  Surgeon: Marlyne Beards, MD;  [facility];  Service: Orthopedics;  Laterality: Left;   TUBAL LIGATION     Social History   Occupational History   Not on file  Tobacco Use   Smoking  status: Never   Smokeless tobacco: Never  Substance and Sexual Activity   Alcohol use: No   Drug use: Not Currently    Types: Marijuana    Comment: last smoked 2 days ago   Sexual activity: Not on file

## 2022-05-06 ENCOUNTER — Ambulatory Visit: Payer: Medicaid Other | Attending: Orthopedic Surgery | Admitting: Occupational Therapy

## 2022-05-06 ENCOUNTER — Encounter: Payer: Self-pay | Admitting: Occupational Therapy

## 2022-05-06 DIAGNOSIS — M6281 Muscle weakness (generalized): Secondary | ICD-10-CM | POA: Diagnosis present

## 2022-05-06 DIAGNOSIS — M25642 Stiffness of left hand, not elsewhere classified: Secondary | ICD-10-CM | POA: Diagnosis present

## 2022-05-06 NOTE — Patient Instructions (Signed)
IP Flexion (Active Blocked)    Brace thumb below tip joint. Bend tip joint as far as possible, and then straighten as far as possible. Repeat __10__ times. Do __2__ sessions per day.

## 2022-05-06 NOTE — Therapy (Signed)
OUTPATIENT OCCUPATIONAL THERAPY TREATMENT  Patient Name: Emily Mccormick MRN: [id number] DOB:02/26/1986, 36 y.o., female Today's Date: 05/06/2022  PCP: Jackey Loge PROVIDER: Sherilyn Cooter, MD    OT End of Session - 05/06/22 0849     Visit Number 4    Number of Visits 13    Date for OT Re-Evaluation 04/26/22    Authorization Type [place] [employer] - awaiting auth    Authorization Time Period 3 visits 03/04/22-08/31/22    Authorization - Visit Number 3    Authorization - Number of Visits 3    OT Start Time 0847    OT Stop Time 0908    OT Time Calculation (min) 21 min    Activity Tolerance Patient tolerated treatment well    Behavior During Therapy [facility] for tasks assessed/performed                 Past Medical History:  Diagnosis Date   Anemia    Hypertension    Seizure ([place])    Seizures ([place])    epilepsy   Past Surgical History:  Procedure Laterality Date   CESAREAN SECTION     REPAIR EXTENSOR TENDON Left 02/04/2022   Procedure: LEFT THUMB REPAIR EXTENSOR TENDON, Percutaneous Pinning of Left Thumb Metacarpophalangeal Joint;  Surgeon: Sherilyn Cooter, MD;  Location: [place];  Service: Orthopedics;  Laterality: Left;   TUBAL LIGATION     Patient Active Problem List   Diagnosis Date Noted   Extensor tendon laceration of finger with open wound    Laceration of extensor muscle, fascia and tendon of left thumb at wrist and hand level, initial encounter 01/30/2022    ONSET DATE: Date of Surgery: 02/04/2022   REFERRING DIAG: S56.429A,S61.209A (ICD-10-CM) - Extensor tendon laceration of finger with open wound, initial encounter  PROCEDURE:  Left thumb EPL repair in zone 3 Left thumb EPB repair in zone 3  THERAPY DIAG:  Stiffness of left hand, not elsewhere classified  Muscle weakness (generalized)  Rationale for Evaluation and Treatment Rehabilitation  SUBJECTIVE:   SUBJECTIVE STATEMENT: Pt reports thumb still hurts from time to time if she  overdoes it, but does not limit her functionally Pt accompanied by: self, mom  PERTINENT HISTORY: PROCEDURE: left thumb EPL and EPB repair in zone 3, Medical hx: Seizures, HTN  PRECAUTIONS: Other: PER EPL/EPB extensor tendon repair PROTOCOL  WEIGHT BEARING RESTRICTIONS Yes  non WB through Lt hand/wrist  PAIN:  Pain in thumb 1-3/10 movement aggravates, rest and heat alleviates   PLOF: Independent, 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 Pt return demo.  Pt does not report any limitations functionally, but occasional mild pain remains if she overdoes it  PATIENT EDUCATION: Education details: thumb IP flex/ext ext (blocking ex) Person educated: Patient Education method: Explanation, Demonstration, and Handouts Education comprehension: verbalized understanding and returned demonstration     HOME EXERCISE PROGRAM: 02/24/22: splint wear and care, precautions, hygiene care, wrist A/ROM 04/07/22: Putty HEP   GOALS:  SHORT TERM GOALS: Target  date: 03/26/2022    Independent with splint wear and care Baseline: issued, may need adjustments Goal status: MET  2.  Independent with initial HEP  Baseline: issued for wrist, will need updates for thumb Goal status: MET  3.  Pt will demo 50% or greater Lt thumb ROM in prep for functional tasks Baseline: Unable to assess due to current precautions  Goal status: MET  4.  Pain Lt thumb with gentle A/ROM 5/10 or under  Baseline: 10/10 Goal status: MET   LONG TERM GOALS: Target date: 04/26/22    Independent with  updated strengthening HEP for Lt hand/thumb Baseline: Unable to assess due to current precautions  Goal status: MET  2.  Grip strength Lt hand to be 30 lbs or greater Baseline: Unable to assess due to current precautions  Goal status: MET (76 LBS)   3.  Pt to return to using Lt hand to assist with ADLS and bilateral tasks Baseline: Unable to assess due to current precautions  Goal status: MET   4.  Pt to have full Lt thumb ROM Baseline: Unable to assess due to current precautions  Goal status: MET (Lacks hyperextension of IP joint)   ASSESSMENT:  CLINICAL IMPRESSION: Pt has met all STG's/LTG's at this time. Pt has no further concerns  PERFORMANCE DEFICITS in functional skills including ADLs, IADLs, coordination, dexterity, sensation, edema, ROM, strength, pain, FMC, decreased knowledge of precautions, wound, skin integrity, and UE functional use,   IMPAIRMENTS are limiting patient from ADLs, IADLs, work, and social participation.   COMORBIDITIES may have co-morbidities  that affects occupational performance. Patient will benefit from skilled OT to address above impairments and improve overall function.  MODIFICATION OR ASSISTANCE TO COMPLETE EVALUATION: Min-Moderate modification of tasks or assist with assess necessary to complete an evaluation.  OT OCCUPATIONAL PROFILE AND HISTORY: Problem focused assessment: Including review of records relating to presenting problem.  CLINICAL DECISION MAKING: LOW - limited treatment options, no task modification necessary  REHAB POTENTIAL: Good  EVALUATION COMPLEXITY: Low      PLAN: OT FREQUENCY: 2x/week  OT DURATION: 8 weeks  PLANNED INTERVENTIONS: self care/ADL training, therapeutic exercise, therapeutic activity, manual therapy, scar mobilization, passive range of motion, splinting, electrical stimulation, ultrasound, paraffin, fluidotherapy, compression bandaging, moist heat, cryotherapy, contrast bath, patient/family  education, coping strategies training, DME and/or AE instructions, and Re-evaluation  RECOMMENDED OTHER SERVICES: NONE  CONSULTED AND AGREED WITH PLAN OF CARE: Patient  PLAN d/c OT    OCCUPATIONAL THERAPY DISCHARGE SUMMARY  Visits from Start of Care: 4  Current functional level related to goals / functional outcomes: Pt has met all goals   Remaining deficits: Mild pain Lt thumb w/ overuse Lack of IP hyperextension Lt thumb   Education / Equipment: HEP's, splint wear and care   Patient agrees to discharge. Patient goals were met. Patient is being discharged due to meeting the stated rehab goals.Hans Eden, OT 05/06/2022, 9:09 AM

## 2022-05-11 ENCOUNTER — Ambulatory Visit: Payer: Medicaid Other | Admitting: Orthopedic Surgery

## 2022-05-14 ENCOUNTER — Ambulatory Visit (INDEPENDENT_AMBULATORY_CARE_PROVIDER_SITE_OTHER): Payer: Medicaid Other | Admitting: Orthopedic Surgery

## 2022-05-14 DIAGNOSIS — S61209A Unspecified open wound of unspecified finger without damage to nail, initial encounter: Secondary | ICD-10-CM | POA: Diagnosis not present

## 2022-05-14 DIAGNOSIS — S66222A Laceration of extensor muscle, fascia and tendon of left thumb at wrist and hand level, initial encounter: Secondary | ICD-10-CM | POA: Diagnosis not present

## 2022-05-14 DIAGNOSIS — S56429A Laceration of extensor muscle, fascia and tendon of unspecified finger at forearm level, initial encounter: Secondary | ICD-10-CM
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
## 2023-08-28 ENCOUNTER — Emergency Department (HOSPITAL_COMMUNITY): Payer: Medicaid Other

## 2023-08-28 ENCOUNTER — Ambulatory Visit (HOSPITAL_BASED_OUTPATIENT_CLINIC_OR_DEPARTMENT_OTHER)
Admission: EM | Admit: 2023-08-28 | Discharge: 2023-08-28 | Disposition: A | Discharge status: Home / Self Care | Payer: Medicaid Other | Attending: Family Medicine | Admitting: Family Medicine

## 2023-08-28 ENCOUNTER — Emergency Department (HOSPITAL_COMMUNITY)
Admission: EM | Admit: 2023-08-28 | Discharge: 2023-08-28 | Disposition: A | Discharge status: Home / Self Care | Payer: Medicaid Other

## 2023-08-28 DIAGNOSIS — M79644 Pain in right finger(s): Secondary | ICD-10-CM | POA: Insufficient documentation

## 2023-08-28 DIAGNOSIS — W01198A Fall on same level from slipping, tripping and stumbling with subsequent striking against other object, initial encounter: Secondary | ICD-10-CM | POA: Diagnosis not present

## 2023-08-28 MED ORDER — IBUPROFEN 800 MG PO TABS
800.0000 mg | ORAL_TABLET | Freq: Once | ORAL | Status: AC
  Administered 2023-08-28: 800 mg via ORAL
  Filled 2023-08-28: qty 1

## 2023-08-28 NOTE — Progress Notes (Signed)
Orthopedic Tech Progress Note Patient Details:  Emily Mccormick 07/12/1986 [id number]  Ortho Devices Type of Ortho Device: Thumb velcro splint Ortho Device/Splint Location: RUE Ortho Device/Splint Interventions: Ordered, Application, Adjustment   Post Interventions Patient Tolerated: Well  Tonye Pearson 08/28/2023, 1:54 PM

## 2023-08-28 NOTE — Discharge Instructions (Signed)
Evaluation today was overall reassuring.  X-ray was negative for acute fracture but did review demonstrated chronic styloid fracture.  Given your swelling and limited mobility placing in a thumb spica splint.  Recommend you follow-up with PCP.  You can treat your symptoms at home with ibuprofen and ice.  You can also use Tylenol for pain.  If your symptoms persist you may need follow-up with a hand specialist.

## 2023-08-28 NOTE — ED Triage Notes (Signed)
Patient reports she tripped last night at work around 8 15pm Caught herself on counter top but injured right thumb Pain rated 10/10 Says she was able to move thumb some last night but is unable to move today Throbbing/aching pain  Some swelling noted to right thumb/hand area

## 2023-08-28 NOTE — ED Triage Notes (Signed)
Patient presents to [facility] for right hand injury. States she fell at work and landed on her hand, states pain, swelling, and limited movement. Shared with patient that we do not have imaging capabilities onsite. Verbalized that she will go to a diff location in [place] to be seen.

## 2023-08-28 NOTE — ED Provider Notes (Signed)
[place] EMERGENCY DEPARTMENT AT [facility] Provider Note   CSN: [id number] Arrival date & time: 08/28/23  1130     History  Chief Complaint  Patient presents with   thumb injury   HPI Emily Mccormick is a 37 y.o. female presenting for right thumb injury.  States she was at work last night when she fell backward and "slammed" her right hand on the countertop to catch herself.  Since then she has had pain and some swelling around the base of her right thumb.  States she still able to move it in all directions but it is painful.  Denies loss of sensation.  Here today because the pain has been persistent overnight.  Has taken no medications to treat her pain and swelling.  HPI     Home Medications Prior to Admission medications   Medication Sig Start Date End Date Taking? Authorizing Provider  ibuprofen (ADVIL) 200 MG tablet Take 800 mg by mouth every 6 (six) hours as needed for moderate pain.    [provider]      Allergies    Patient has no known allergies.    Review of Systems   See HPI  Physical Exam Updated Vital Signs BP 108/64 (BP Location: Right Arm)   Pulse 71   Temp 98.5 F (36.9 C) (Oral)   Resp 17   Ht 5\' 4"  (1.626 m)   Wt 83.9 kg   LMP 08/10/2023   SpO2 100%   BMI 31.76 kg/m  Physical Exam Constitutional:      Appearance: Normal appearance.  HENT:     Head: Normocephalic.     Nose: Nose normal.  Eyes:     Conjunctiva/sclera: Conjunctivae normal.  Pulmonary:     Effort: Pulmonary effort is normal.  Musculoskeletal:     Right hand: Swelling and tenderness present. Decreased range of motion. Normal sensation. There is no disruption of two-point discrimination. Normal capillary refill. Normal pulse.     Comments: Some subtle swelling around the base of the right thumb.  Also tenderness to palpation of the right thumb base of.  Able to move her thumb in all directions but limited by pain.   Neurological:     Mental Status: She is  alert.  Psychiatric:        Mood and Affect: Mood normal.     ED Results / Procedures / Treatments   Labs (all labs ordered are listed, but only abnormal results are displayed) Labs Reviewed - No data to display  EKG None  Radiology DG Hand Complete Right Result Date: 08/28/2023 CLINICAL DATA:  Injury. Swelling and pain. Tripped last night at work and caught self on counter top. Injured right thumb. EXAM: RIGHT HAND - COMPLETE 3+ VIEW COMPARISON:  Right wrist radiographs 11/19/2022 FINDINGS: Unchanged 1.5 mm ulnar positive variance. Minimal distal radioulnar joint space narrowing and peripheral spurring. 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 There is L corticated 2 mm chronic ossicle at the lateral base of the proximal phalanx of the third finger. No acute fracture or dislocation. IMPRESSION: 1. No acute fracture. 2. 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 Electronically Signed   By: Neita Garnet M.D.   On: 08/28/2023 12:43    Procedures Procedures    Medications Ordered in ED Medications  ibuprofen (ADVIL) tablet 800 mg (800 mg Oral Given 08/28/23 1306)    ED Course/ Medical Decision Making/ A&P  Medical Decision Making Amount and/or Complexity of Data Reviewed Radiology: ordered.  Risk Prescription drug management.   37 year old well-appearing female presenting for right thumb injury. Exam is notable for swelling and tenderness around the right thumb base along with limited range of motion but neurovascularly intact. X-ray did not reveal acute fracture but did redemonstrate chronic styloid fracture.  Applied thumb spica and advised to follow-up PCP.  Also stated if her symptoms persisted after conservative treatment at home she may need to follow-up with a Hand.  Vitals stable.  Discharged good condition.        Final Clinical Impression(s) / ED Diagnoses Final diagnoses:  Pain of right thumb     Rx / DC Orders ED Discharge Orders     None         Gareth Eagle, PA-C 08/28/23 1337    Durwin Glaze, MD 08/28/23 1455

## 2023-12-12 ENCOUNTER — Encounter (HOSPITAL_BASED_OUTPATIENT_CLINIC_OR_DEPARTMENT_OTHER): Payer: Self-pay

## 2023-12-12 ENCOUNTER — Ambulatory Visit (HOSPITAL_BASED_OUTPATIENT_CLINIC_OR_DEPARTMENT_OTHER): Admission: EM | Admit: 2023-12-12 | Discharge: 2023-12-12 | Disposition: A | Discharge status: Home / Self Care

## 2023-12-12 DIAGNOSIS — B349 Viral infection, unspecified: Secondary | ICD-10-CM

## 2023-12-12 LAB — POC COVID19/FLU A&B COMBO
Covid Antigen, POC: NEGATIVE
Influenza A Antigen, POC: NEGATIVE
Influenza B Antigen, POC: NEGATIVE

## 2023-12-12 NOTE — ED Provider Notes (Signed)
 [name]    CSN: [id number] Arrival date & time: 12/12/23  1124      History   Chief Complaint Chief Complaint  Patient presents with   Nasal Congestion   Cough   pressure around eyes   Generalized Body Aches    HPI Emily Mccormick is a 38 y.o. female.    Cough Cough characteristics:  Productive Sputum characteristics:  Green Severity:  Moderate Onset quality:  Gradual Duration:  1 day Timing:  Constant Progression:  Unchanged Chronicity:  New Smoker: no   Relieved by:  None tried Worsened by:  Nothing Ineffective treatments:  None tried Associated symptoms: chills, ear fullness, headaches, myalgias, rhinorrhea, sinus congestion and sore throat   Associated symptoms: no chest pain, no ear pain, no eye discharge and no shortness of breath     Past Medical History:  Diagnosis Date   Anemia    Hypertension    Seizure (HCC)    Seizures (HCC)    epilepsy    Patient Active Problem List   Diagnosis Date Noted   Extensor tendon laceration of finger with open wound    Laceration of extensor muscle, fascia and tendon of left thumb at wrist and hand level, initial encounter 01/30/2022    Past Surgical History:  Procedure Laterality Date   CESAREAN SECTION     REPAIR EXTENSOR TENDON Left 02/04/2022   Procedure: LEFT THUMB REPAIR EXTENSOR TENDON, Percutaneous Pinning of Left Thumb Metacarpophalangeal Joint;  Surgeon: Marlyne Beards, MD;  Location: [facility] OR;  Service: Orthopedics;  Laterality: Left;   TUBAL LIGATION      OB History   No obstetric history on file.      Home Medications    Prior to Admission medications   Medication Sig Start Date End Date Taking? Authorizing Provider  cetirizine (ZYRTEC) 10 MG tablet Take 10 mg by mouth daily.   Yes [provider]  ibuprofen (ADVIL) 200 MG tablet Take 800 mg by mouth every 6 (six) hours as needed for moderate pain.    [provider]    Family History History reviewed. No  pertinent family history.  Social History Social History   Tobacco Use   Smoking status: Never   Smokeless tobacco: Never  Substance Use Topics   Alcohol use: No   Drug use: Not Currently    Types: Marijuana    Comment: last smoked 2 days ago     Allergies   Patient has no known allergies.   Review of Systems Review of Systems  Constitutional:  Positive for chills.  HENT:  Positive for rhinorrhea and sore throat. Negative for ear pain.   Eyes:  Negative for discharge.  Respiratory:  Positive for cough. Negative for shortness of breath.   Cardiovascular:  Negative for chest pain.  Musculoskeletal:  Positive for myalgias.  Neurological:  Positive for headaches.     Physical Exam Triage Vital Signs ED Triage Vitals  Encounter Vitals Group     BP 12/12/23 1132 107/74     Systolic BP Percentile --      Diastolic BP Percentile --      Pulse Rate 12/12/23 1132 72     Resp 12/12/23 1132 20     Temp 12/12/23 1132 98.5 F (36.9 C)     Temp Source 12/12/23 1132 Oral     SpO2 12/12/23 1132 96 %     Weight --      Height --      Head Circumference --  Peak Flow --      Pain Score 12/12/23 1134 8     Pain Loc --      Pain Education --      Exclude from Growth Chart --    No data found.  Updated Vital Signs BP 107/74 (BP Location: Right Arm)   Pulse 72   Temp 98.5 F (36.9 C) (Oral)   Resp 20   LMP 11/29/2023   SpO2 96%   Visual Acuity Right Eye Distance:   Left Eye Distance:   Bilateral Distance:    Right Eye Near:   Left Eye Near:    Bilateral Near:     Physical Exam Constitutional:      General: She is not in acute distress.    Appearance: Normal appearance. She is not ill-appearing, toxic-appearing or diaphoretic.  HENT:     Head: Normocephalic and atraumatic.     Right Ear: Tympanic membrane and ear canal normal.     Left Ear: Tympanic membrane and ear canal normal.     Nose: Congestion and rhinorrhea present.     Mouth/Throat:      Pharynx: Oropharynx is clear.  Eyes:     Conjunctiva/sclera: Conjunctivae normal.  Cardiovascular:     Rate and Rhythm: Normal rate and regular rhythm.     Pulses: Normal pulses.     Heart sounds: Normal heart sounds.  Pulmonary:     Effort: Pulmonary effort is normal.     Breath sounds: Normal breath sounds.  Skin:    General: Skin is warm and dry.  Neurological:     Mental Status: She is alert.  Psychiatric:        Mood and Affect: Mood normal.      UC Treatments / Results  Labs (all labs ordered are listed, but only abnormal results are displayed) Labs Reviewed  POC COVID19/FLU A&B COMBO - Normal    EKG   Radiology No results found.  Procedures Procedures (including critical care time)  Medications Ordered in UC Medications - No data to display  Initial Impression / Assessment and Plan / UC Course  I have reviewed the triage vital signs and the nursing notes.  Pertinent labs & imaging results that were available during my care of the patient were reviewed by me and considered in my medical decision making (see chart for details).     Viral illness-no concerns on exam.  Recommend symptomatic treatment for relief of symptoms. Rest, hydrate and follow-up as needed  Final Clinical Impressions(s) / UC Diagnoses   Final diagnoses:  Viral illness     Discharge Instructions      Your testing is negative for flu and covid.  This is most likely viral You can take OTC mediations to include motrin, tylenol and mucinex for symptoms.  Rest, Hydrate and follow up as needed.    ED Prescriptions   None    PDMP not reviewed this encounter.   Janace Aris, FNP 12/12/23 1313

## 2023-12-12 NOTE — Discharge Instructions (Signed)
 Your testing is negative for flu and covid.  This is most likely viral You can take OTC mediations to include motrin, tylenol and mucinex for symptoms.  Rest, Hydrate and follow up as needed.

## 2023-12-12 NOTE — ED Triage Notes (Signed)
 States developed runny nose yesterday. By last night, had body aches, chills, pressure around eyes and cough today with sore throat.

## 2024-06-01 ENCOUNTER — Encounter: Payer: Self-pay | Admitting: Nurse Practitioner

## 2024-06-08 ENCOUNTER — Ambulatory Visit

## 2024-06-15 ENCOUNTER — Other Ambulatory Visit: Payer: Self-pay

## 2024-06-15 DIAGNOSIS — R928 Other abnormal and inconclusive findings on diagnostic imaging of breast: Secondary | ICD-10-CM

## 2024-07-10 ENCOUNTER — Institutional Professional Consult (permissible substitution): Payer: Self-pay | Admitting: Student

## 2024-08-03 ENCOUNTER — Other Ambulatory Visit

## 2024-08-03 ENCOUNTER — Encounter

## 2024-08-03 ENCOUNTER — Ambulatory Visit: Payer: Self-pay
